# Patient Record
Sex: Male | Born: 1960 | Race: Black or African American | Hispanic: No | Smoking: Current every day smoker
Health system: Southern US, Community
[De-identification: ages and names within clinical notes are randomized; demographics above are authoritative.]

## PROBLEM LIST (undated history)

## (undated) DIAGNOSIS — E119 Type 2 diabetes mellitus without complications: Secondary | ICD-10-CM

## (undated) DIAGNOSIS — F32A Depression, unspecified: Secondary | ICD-10-CM

## (undated) DIAGNOSIS — I10 Essential (primary) hypertension: Secondary | ICD-10-CM

## (undated) DIAGNOSIS — B1920 Unspecified viral hepatitis C without hepatic coma: Secondary | ICD-10-CM

## (undated) DIAGNOSIS — F329 Major depressive disorder, single episode, unspecified: Secondary | ICD-10-CM

---

## 2014-12-07 ENCOUNTER — Emergency Department (HOSPITAL_COMMUNITY)
Admission: EM | Admit: 2014-12-07 | Discharge: 2014-12-07 | Disposition: A | Discharge status: Home / Self Care | Payer: Self-pay | Attending: Emergency Medicine | Admitting: Emergency Medicine

## 2014-12-07 ENCOUNTER — Encounter (HOSPITAL_COMMUNITY): Payer: Self-pay | Admitting: Emergency Medicine

## 2014-12-07 DIAGNOSIS — Z720 Tobacco use: Secondary | ICD-10-CM | POA: Insufficient documentation

## 2014-12-07 DIAGNOSIS — Z8659 Personal history of other mental and behavioral disorders: Secondary | ICD-10-CM | POA: Insufficient documentation

## 2014-12-07 DIAGNOSIS — Z9119 Patient's noncompliance with other medical treatment and regimen: Secondary | ICD-10-CM | POA: Insufficient documentation

## 2014-12-07 DIAGNOSIS — R739 Hyperglycemia, unspecified: Secondary | ICD-10-CM

## 2014-12-07 DIAGNOSIS — I10 Essential (primary) hypertension: Secondary | ICD-10-CM | POA: Insufficient documentation

## 2014-12-07 DIAGNOSIS — Z8619 Personal history of other infectious and parasitic diseases: Secondary | ICD-10-CM | POA: Insufficient documentation

## 2014-12-07 DIAGNOSIS — J029 Acute pharyngitis, unspecified: Secondary | ICD-10-CM | POA: Insufficient documentation

## 2014-12-07 DIAGNOSIS — Z9114 Patient's other noncompliance with medication regimen: Secondary | ICD-10-CM

## 2014-12-07 DIAGNOSIS — E1165 Type 2 diabetes mellitus with hyperglycemia: Secondary | ICD-10-CM | POA: Insufficient documentation

## 2014-12-07 HISTORY — DX: Essential (primary) hypertension: I10

## 2014-12-07 HISTORY — DX: Depression, unspecified: F32.A

## 2014-12-07 HISTORY — DX: Major depressive disorder, single episode, unspecified: F32.9

## 2014-12-07 HISTORY — DX: Unspecified viral hepatitis C without hepatic coma: B19.20

## 2014-12-07 HISTORY — DX: Type 2 diabetes mellitus without complications: E11.9

## 2014-12-07 LAB — URINALYSIS, ROUTINE W REFLEX MICROSCOPIC
Bilirubin Urine: NEGATIVE
Glucose, UA: >1000 mg/dL — AB
Hgb urine dipstick: NEGATIVE
KETONES UR: NEGATIVE mg/dL
LEUKOCYTES UA: NEGATIVE
NITRITE: NEGATIVE
PH: 6.5 (ref 5.0–8.0)
Protein, ur: NEGATIVE mg/dL
SPECIFIC GRAVITY, URINE: 1.028 (ref 1.005–1.030)
UROBILINOGEN UA: 1 mg/dL (ref 0.0–1.0)

## 2014-12-07 LAB — CBC
HEMATOCRIT: 33.9 % — AB (ref 39.0–52.0)
Hemoglobin: 12 g/dL — ABNORMAL LOW (ref 13.0–17.0)
MCH: 33.7 pg (ref 26.0–34.0)
MCHC: 35.4 g/dL (ref 30.0–36.0)
MCV: 95.2 fL (ref 78.0–100.0)
Platelets: 180 10*3/uL (ref 150–400)
RBC: 3.56 MIL/uL — AB (ref 4.22–5.81)
RDW: 11.9 % (ref 11.5–15.5)
WBC: 8.5 10*3/uL (ref 4.0–10.5)

## 2014-12-07 LAB — BASIC METABOLIC PANEL
ANION GAP: 10 (ref 5–15)
BUN: 14 mg/dL (ref 6–20)
CHLORIDE: 96 mmol/L — AB (ref 101–111)
CO2: 23 mmol/L (ref 22–32)
Calcium: 8.9 mg/dL (ref 8.9–10.3)
Creatinine, Ser: 1.08 mg/dL (ref 0.61–1.24)
GFR calc non Af Amer: >60 mL/min (ref >60)
Glucose, Bld: 523 mg/dL — ABNORMAL HIGH (ref 65–99)
POTASSIUM: 4.1 mmol/L (ref 3.5–5.1)
SODIUM: 129 mmol/L — AB (ref 135–145)

## 2014-12-07 LAB — CBG MONITORING, ED: Glucose-Capillary: 539 mg/dL — ABNORMAL HIGH (ref 65–99)

## 2014-12-07 LAB — URINE MICROSCOPIC-ADD ON

## 2014-12-07 LAB — RAPID STREP SCREEN (MED CTR MEBANE ONLY): STREPTOCOCCUS, GROUP A SCREEN (DIRECT): NEGATIVE

## 2014-12-07 MED ORDER — SODIUM CHLORIDE 0.9 % IV SOLN
1000.0000 mL | Freq: Once | INTRAVENOUS | Status: AC
  Administered 2014-12-07: 1000 mL via INTRAVENOUS

## 2014-12-07 MED ORDER — ACETAMINOPHEN 325 MG PO TABS
650.0000 mg | ORAL_TABLET | Freq: Once | ORAL | Status: AC
  Administered 2014-12-07: 650 mg via ORAL
  Filled 2014-12-07: qty 2

## 2014-12-07 MED ORDER — INSULIN ASPART 100 UNIT/ML ~~LOC~~ SOLN
10.0000 [IU] | Freq: Once | SUBCUTANEOUS | Status: AC
  Administered 2014-12-07: 10 [IU] via INTRAVENOUS
  Filled 2014-12-07: qty 1

## 2014-12-07 MED ORDER — IBUPROFEN 600 MG PO TABS
600.0000 mg | ORAL_TABLET | Freq: Three times a day (TID) | ORAL | Status: DC | PRN
Start: 2014-12-07 — End: 2015-11-25

## 2014-12-07 MED ORDER — MORPHINE SULFATE (PF) 4 MG/ML IV SOLN
4.0000 mg | Freq: Once | INTRAVENOUS | Status: AC
  Administered 2014-12-07: 4 mg via INTRAVENOUS
  Filled 2014-12-07: qty 1

## 2014-12-07 MED ORDER — INSULIN NPH ISOPHANE & REGULAR (70-30) 100 UNIT/ML ~~LOC~~ SUSP: 30.0000 [IU] | Freq: Two times a day (BID) | SUBCUTANEOUS | Status: AC

## 2014-12-07 MED ORDER — SODIUM CHLORIDE 0.9 % IV SOLN
1000.0000 mL | INTRAVENOUS | Status: DC
  Administered 2014-12-07: 1000 mL via INTRAVENOUS

## 2014-12-07 MED ORDER — CLINDAMYCIN HCL 300 MG PO CAPS
300.0000 mg | ORAL_CAPSULE | Freq: Once | ORAL | Status: AC
  Administered 2014-12-07: 300 mg via ORAL
  Filled 2014-12-07: qty 1

## 2014-12-07 MED ORDER — CLINDAMYCIN HCL 300 MG PO CAPS: 300.0000 mg | ORAL_CAPSULE | Freq: Three times a day (TID) | ORAL | Status: DC

## 2014-12-07 NOTE — Discharge Instructions (Signed)
Pharyngitis Pharyngitis is redness, pain, and swelling (inflammation) of your pharynx.  CAUSES  Pharyngitis is usually caused by infection. Most of the time, these infections are from viruses (viral) and are part of a cold. However, sometimes pharyngitis is caused by bacteria (bacterial). Pharyngitis can also be caused by allergies. Viral pharyngitis may be spread from person to person by coughing, sneezing, and personal items or utensils (cups, forks, spoons, toothbrushes). Bacterial pharyngitis may be spread from person to person by more intimate contact, such as kissing.  SIGNS AND SYMPTOMS  Symptoms of pharyngitis include:   Sore throat.   Tiredness (fatigue).   Low-grade fever.   Headache.  Joint pain and muscle aches.  Skin rashes.  Swollen lymph nodes.  Plaque-like film on throat or tonsils (often seen with bacterial pharyngitis). DIAGNOSIS  Your health care provider will ask you questions about your illness and your symptoms. Your medical history, along with a physical exam, is often all that is needed to diagnose pharyngitis. Sometimes, a rapid strep test is done. Other lab tests may also be done, depending on the suspected cause.  TREATMENT  Viral pharyngitis will usually get better in 3-4 days without the use of medicine. Bacterial pharyngitis is treated with medicines that kill germs (antibiotics).  HOME CARE INSTRUCTIONS   Drink enough water and fluids to keep your urine clear or pale yellow.   Only take over-the-counter or prescription medicines as directed by your health care provider:   If you are prescribed antibiotics, make sure you finish them even if you start to feel better.   Do not take aspirin.   Get lots of rest.   Gargle with 8 oz of salt water ( tsp of salt per 1 qt of water) as often as every 1-2 hours to soothe your throat.   Throat lozenges (if you are not at risk for choking) or sprays may be used to soothe your throat. SEEK MEDICAL  CARE IF:   You have large, tender lumps in your neck.  You have a rash.  You cough up green, yellow-brown, or bloody spit. SEEK IMMEDIATE MEDICAL CARE IF:   Your neck becomes stiff.  You drool or are unable to swallow liquids.  You vomit or are unable to keep medicines or liquids down.  You have severe pain that does not go away with the use of recommended medicines.  You have trouble breathing (not caused by a stuffy nose). MAKE SURE YOU:   Understand these instructions.  Will watch your condition.  Will get help right away if you are not doing well or get worse. Document Released: 04/06/2005 Document Revised: 01/25/2013 Document Reviewed: 12/12/2012 [facility] Patient Information 2015 [place], [place]. This information is not intended to replace advice given to you by your health care provider. Make sure you discuss any questions you have with your health care provider. Hyperglycemia Hyperglycemia occurs when the glucose (sugar) in your blood is too high. Hyperglycemia can happen for many reasons, but it most often happens to people who do not know they have diabetes or are not managing their diabetes properly.  CAUSES  Whether you have diabetes or not, there are other causes of hyperglycemia. Hyperglycemia can occur when you have diabetes, but it can also occur in other situations that you might not be as aware of, such as: Diabetes  If you have diabetes and are having problems controlling your blood glucose, hyperglycemia could occur because of some of the following reasons:  Not following your meal plan.  Not taking your diabetes medications or not taking it properly.  Exercising less or doing less activity than you normally do.  Being sick. Pre-diabetes  This cannot be ignored. Before people develop Type 2 diabetes, they almost always have "pre-diabetes." This is when your blood glucose levels are higher than normal, but not yet high enough to be diagnosed as diabetes.  Research has shown that some long-term damage to the body, especially the heart and circulatory system, may already be occurring during pre-diabetes. If you take action to manage your blood glucose when you have pre-diabetes, you may delay or prevent Type 2 diabetes from developing. Stress  If you have diabetes, you may be "diet" controlled or on oral medications or insulin to control your diabetes. However, you may find that your blood glucose is higher than usual in the hospital whether you have diabetes or not. This is often referred to as "stress hyperglycemia." Stress can elevate your blood glucose. This happens because of hormones put out by the body during times of stress. If stress has been the cause of your high blood glucose, it can be followed regularly by your caregiver. That way he/she can make sure your hyperglycemia does not continue to get worse or progress to diabetes. Steroids  Steroids are medications that act on the infection fighting system (immune system) to block inflammation or infection. One side effect can be a rise in blood glucose. Most people can produce enough extra insulin to allow for this rise, but for those who cannot, steroids make blood glucose levels go even higher. It is not unusual for steroid treatments to "uncover" diabetes that is developing. It is not always possible to determine if the hyperglycemia will go away after the steroids are stopped. A special blood test called an A1c is sometimes done to determine if your blood glucose was elevated before the steroids were started. SYMPTOMS  Thirsty.  Frequent urination.  Dry mouth.  Blurred vision.  Tired or fatigue.  Weakness.  Sleepy.  Tingling in feet or leg. DIAGNOSIS  Diagnosis is made by monitoring blood glucose in one or all of the following ways:  A1c test. This is a chemical found in your blood.  Fingerstick blood glucose monitoring.  Laboratory results. TREATMENT  First, knowing the  cause of the hyperglycemia is important before the hyperglycemia can be treated. Treatment may include, but is not be limited to:  Education.  Change or adjustment in medications.  Change or adjustment in meal plan.  Treatment for an illness, infection, etc.  More frequent blood glucose monitoring.  Change in exercise plan.  Decreasing or stopping steroids.  Lifestyle changes. HOME CARE INSTRUCTIONS   Test your blood glucose as directed.  Exercise regularly. Your caregiver will give you instructions about exercise. Pre-diabetes or diabetes which comes on with stress is helped by exercising.  Eat wholesome, balanced meals. Eat often and at regular, fixed times. Your caregiver or nutritionist will give you a meal plan to guide your sugar intake.  Being at an ideal weight is important. If needed, losing as little as 10 to 15 pounds may help improve blood glucose levels. SEEK MEDICAL CARE IF:   You have questions about medicine, activity, or diet.  You continue to have symptoms (problems such as increased thirst, urination, or weight gain). SEEK IMMEDIATE MEDICAL CARE IF:   You are vomiting or have diarrhea.  Your breath smells fruity.  You are breathing faster or slower.  You are very sleepy or incoherent.  You have numbness, tingling, or pain in your feet or hands.  You have chest pain.  Your symptoms get worse even though you have been following your caregiver's orders.  If you have any other questions or concerns. Document Released: 09/30/2000 Document Revised: 06/29/2011 Document Reviewed: 08/03/2011 [facility] Patient Information 2015 [place], [place]. This information is not intended to replace advice given to you by your health care provider. Make sure you discuss any questions you have with your health care provider.

## 2014-12-07 NOTE — ED Provider Notes (Signed)
CSN: [id number]     Arrival date & time 12/07/14  0505 History   First MD Initiated Contact with Patient 12/07/14 0518     Chief Complaint  Patient presents with  . Sore Throat  . Torticollis      HPI Patient reports sore throat over the past 48 hours.  He also reports some low back pain is not worse and is radiating up his back and into his neck.  He denies weakness of his arms or legs.  He denies pain with range of motion of his neck.  He reports ongoing sore throat.  He has not been on antibiotics.  He's tried Tylenol without improvement in symptoms.  He denies weakness of his arms or legs.  His pain is moderate in severity at this time.  He's tried Tylenol without improvement   Past Medical History  Diagnosis Date  . Diabetes mellitus without complication   . Hypertension   . Hepatitis C   . Depression    History reviewed. No pertinent past surgical history. No family history on file. Social History  Substance Use Topics  . Smoking status: Current Every Day Smoker -- 1.00 packs/day for 40 years  . Smokeless tobacco: None  . Alcohol Use: Yes     Comment: couple beers/day    Review of Systems  All other systems reviewed and are negative.     Allergies  Review of patient's allergies indicates no known allergies.  Home Medications   Prior to Admission medications   Medication Sig Start Date End Date Taking? Authorizing Provider  acetaminophen (TYLENOL) 500 MG tablet Take 1,000 mg by mouth every 6 (six) hours as needed for mild pain.   Yes Historical Provider, MD  RaNITidine HCl (ACID REDUCER PO) Take 1-2 tablets by mouth daily as needed (acid).   Yes Historical Provider, MD  clindamycin (CLEOCIN) 300 MG capsule Take 1 capsule (300 mg total) by mouth 3 (three) times daily. 12/07/14   Jola Schmidt, MD  ibuprofen (ADVIL,MOTRIN) 600 MG tablet Take 1 tablet (600 mg total) by mouth every 8 (eight) hours as needed. 12/07/14   Jola Schmidt, MD  insulin NPH-regular Human (NOVOLIN  70/30) (70-30) 100 UNIT/ML injection Inject 30 Units into the skin 2 (two) times daily with a meal. 12/07/14   Jola Schmidt, MD   BP 160/96 mmHg  Pulse 79  Temp(Src) 98.4 F (36.9 C) (Oral)  Resp 18  Ht 6' (1.829 m)  Wt 160 lb (72.576 kg)  BMI 21.70 kg/m2  SpO2 91% Physical Exam  Constitutional: He is oriented to person, place, and time. He appears well-developed and well-nourished.  HENT:  Head: Normocephalic and atraumatic.  Posterior pharyngeal erythema and exudate.  Uvula midline.  Tolerating secretions.  Oral airway patent.  Eyes: EOM are normal.  Neck: Normal range of motion.  Cardiovascular: Normal rate, regular rhythm, normal heart sounds and intact distal pulses.   Pulmonary/Chest: Effort normal and breath sounds normal. No respiratory distress.  Abdominal: Soft. He exhibits no distension. There is no tenderness.  Musculoskeletal: Normal range of motion.  Neurological: He is alert and oriented to person, place, and time.  Skin: Skin is warm and dry.  Psychiatric: He has a normal mood and affect. Judgment normal.  Nursing note and vitals reviewed.   ED Course  Procedures (including critical care time) Labs Review Labs Reviewed  BASIC METABOLIC PANEL - Abnormal; Notable for the following:    Sodium 129 (*)    Chloride 96 (*)  Glucose, Bld 523 (*)    All other components within normal limits  CBC - Abnormal; Notable for the following:    RBC 3.56 (*)    Hemoglobin 12.0 (*)    HCT 33.9 (*)    All other components within normal limits  CBG MONITORING, ED - Abnormal; Notable for the following:    Glucose-Capillary 539 (*)    All other components within normal limits  RAPID STREP SCREEN (NOT AT [facility])  CULTURE, GROUP A STREP  URINALYSIS, ROUTINE W REFLEX MICROSCOPIC (NOT AT [facility])  CBG MONITORING, ED    Imaging Review No results found. I have personally reviewed and evaluated these images and lab results as part of my medical decision-making.   EKG  Interpretation None      MDM   Final diagnoses:  Pharyngitis  Hyperglycemia  H/O medication noncompliance    Patient is feeling better after IV fluids and pain medication.  Likely pharyngitis.  Doubt meningitis.  No meningeal signs.  Full range of motion of neck.  Discharge home in good condition.  Patient will need a primary care physician.  Medication refills given for his insulin.  Home on clindamycin.    Jola Schmidt, MD 12/07/14 [phone]

## 2014-12-07 NOTE — ED Notes (Signed)
Pt in from home C/O stiff neck, neck pain, back pain. Also reports a sore throat. Did state he injured his back at work Wednesday and pain has gotten worse since then

## 2014-12-09 LAB — CULTURE, GROUP A STREP: STREP A CULTURE: NEGATIVE

## 2015-06-04 DIAGNOSIS — R739 Hyperglycemia, unspecified: Secondary | ICD-10-CM

## 2015-06-12 LAB — GLUCOSE, POCT (MANUAL RESULT ENTRY): POC GLUCOSE: 512 mg/dL — AB (ref 70–99)

## 2015-06-12 NOTE — Congregational Nurse Program (Signed)
Congregational Nurse Program Note  Date of Encounter: 06/04/2015  Past Medical History: Past Medical History  Diagnosis Date  . Diabetes mellitus without complication   . Hypertension   . Hepatitis C   . Depression     Encounter Details:     CNP Questionnaire - 06/06/15 1941    Patient Demographics   Is this a new or existing patient? New   Patient is considered a/an Not Applicable   Race African-American/Black   Patient Assistance   Location of Patient Assistance Not Applicable   Patient's financial/insurance status Low Income;[place]   Uninsured Patient Yes   Interventions Counseled to make appt. with provider   Patient referred to apply for the following financial assistance Not Applicable   Food insecurities addressed Provided food supplies   Transportation assistance No   Assistance securing medications No   Educational health offerings Chronic disease;Diabetes   Encounter Details   Primary purpose of visit Chronic Illness/Condition Visit;Education/Health Concerns   Was an Emergency Department visit averted? Yes   Does patient have a medical provider? Yes   Patient referred to Clinic   Was a mental health screening completed? (GAINS tool) No   Does patient have dental issues? No   Does patient have vision issues? No   Since previous encounter, have you referred patient for abnormal blood pressure that resulted in a new diagnosis or medication change? No   Since previous encounter, have you referred patient for abnormal blood glucose that resulted in a new diagnosis or medication change? No   For Abstraction Use Only   Does patient have insurance? No     Requested CBG, stated he felt like his "sugar was high".  CBG 512.  Self-administered insulin.  States he feels ok, just knew his "sugar was high"

## 2015-11-25 ENCOUNTER — Inpatient Hospital Stay (HOSPITAL_COMMUNITY): Payer: Medicaid Other

## 2015-11-25 ENCOUNTER — Encounter (HOSPITAL_COMMUNITY): Payer: Self-pay | Admitting: Emergency Medicine

## 2015-11-25 ENCOUNTER — Inpatient Hospital Stay (HOSPITAL_COMMUNITY)
Admission: EM | Admit: 2015-11-25 | Discharge: 2015-11-28 | DRG: 441 | Disposition: A | Discharge status: Hospice | Payer: Medicaid Other | Attending: Internal Medicine | Admitting: Internal Medicine

## 2015-11-25 ENCOUNTER — Emergency Department (HOSPITAL_COMMUNITY): Payer: Medicaid Other

## 2015-11-25 DIAGNOSIS — M549 Dorsalgia, unspecified: Secondary | ICD-10-CM | POA: Diagnosis present

## 2015-11-25 DIAGNOSIS — B370 Candidal stomatitis: Secondary | ICD-10-CM | POA: Diagnosis present

## 2015-11-25 DIAGNOSIS — E871 Hypo-osmolality and hyponatremia: Secondary | ICD-10-CM | POA: Diagnosis present

## 2015-11-25 DIAGNOSIS — R188 Other ascites: Secondary | ICD-10-CM | POA: Diagnosis present

## 2015-11-25 DIAGNOSIS — F329 Major depressive disorder, single episode, unspecified: Secondary | ICD-10-CM | POA: Diagnosis present

## 2015-11-25 DIAGNOSIS — F17200 Nicotine dependence, unspecified, uncomplicated: Secondary | ICD-10-CM | POA: Diagnosis present

## 2015-11-25 DIAGNOSIS — Z7189 Other specified counseling: Secondary | ICD-10-CM

## 2015-11-25 DIAGNOSIS — B1920 Unspecified viral hepatitis C without hepatic coma: Secondary | ICD-10-CM | POA: Diagnosis present

## 2015-11-25 DIAGNOSIS — I81 Portal vein thrombosis: Principal | ICD-10-CM | POA: Diagnosis present

## 2015-11-25 DIAGNOSIS — R1084 Generalized abdominal pain: Secondary | ICD-10-CM | POA: Diagnosis present

## 2015-11-25 DIAGNOSIS — C220 Liver cell carcinoma: Secondary | ICD-10-CM | POA: Diagnosis present

## 2015-11-25 DIAGNOSIS — Z794 Long term (current) use of insulin: Secondary | ICD-10-CM | POA: Diagnosis not present

## 2015-11-25 DIAGNOSIS — K920 Hematemesis: Secondary | ICD-10-CM | POA: Diagnosis not present

## 2015-11-25 DIAGNOSIS — Z7984 Long term (current) use of oral hypoglycemic drugs: Secondary | ICD-10-CM

## 2015-11-25 DIAGNOSIS — E119 Type 2 diabetes mellitus without complications: Secondary | ICD-10-CM | POA: Diagnosis present

## 2015-11-25 DIAGNOSIS — M7989 Other specified soft tissue disorders: Secondary | ICD-10-CM

## 2015-11-25 DIAGNOSIS — I10 Essential (primary) hypertension: Secondary | ICD-10-CM | POA: Diagnosis present

## 2015-11-25 DIAGNOSIS — E860 Dehydration: Secondary | ICD-10-CM | POA: Diagnosis present

## 2015-11-25 DIAGNOSIS — Z515 Encounter for palliative care: Secondary | ICD-10-CM | POA: Diagnosis present

## 2015-11-25 DIAGNOSIS — R64 Cachexia: Secondary | ICD-10-CM | POA: Diagnosis present

## 2015-11-25 DIAGNOSIS — R109 Unspecified abdominal pain: Secondary | ICD-10-CM

## 2015-11-25 DIAGNOSIS — Z720 Tobacco use: Secondary | ICD-10-CM

## 2015-11-25 DIAGNOSIS — Z66 Do not resuscitate: Secondary | ICD-10-CM | POA: Diagnosis not present

## 2015-11-25 DIAGNOSIS — R160 Hepatomegaly, not elsewhere classified: Secondary | ICD-10-CM

## 2015-11-25 DIAGNOSIS — E43 Unspecified severe protein-calorie malnutrition: Secondary | ICD-10-CM | POA: Diagnosis present

## 2015-11-25 LAB — HEPATIC FUNCTION PANEL
ALBUMIN: 2.7 g/dL — AB (ref 3.5–5.0)
ALT: 73 U/L — ABNORMAL HIGH (ref 17–63)
AST: 186 U/L — ABNORMAL HIGH (ref 15–41)
Alkaline Phosphatase: 333 U/L — ABNORMAL HIGH (ref 38–126)
BILIRUBIN INDIRECT: 0.9 mg/dL (ref 0.3–0.9)
BILIRUBIN TOTAL: 1.9 mg/dL — AB (ref 0.3–1.2)
Bilirubin, Direct: 1 mg/dL — ABNORMAL HIGH (ref 0.1–0.5)
TOTAL PROTEIN: 8.2 g/dL — AB (ref 6.5–8.1)

## 2015-11-25 LAB — BODY FLUID CELL COUNT WITH DIFFERENTIAL
Lymphs, Fluid: 16 %
Monocyte-Macrophage-Serous Fluid: 37 % — ABNORMAL LOW (ref 50–90)
Neutrophil Count, Fluid: 47 % — ABNORMAL HIGH (ref 0–25)
WBC FLUID: 780 uL (ref 0–1000)

## 2015-11-25 LAB — CBC
HEMATOCRIT: 37.7 % — AB (ref 39.0–52.0)
Hemoglobin: 13.1 g/dL (ref 13.0–17.0)
MCH: 31.7 pg (ref 26.0–34.0)
MCHC: 34.7 g/dL (ref 30.0–36.0)
MCV: 91.3 fL (ref 78.0–100.0)
Platelets: 321 10*3/uL (ref 150–400)
RBC: 4.13 MIL/uL — ABNORMAL LOW (ref 4.22–5.81)
RDW: 13 % (ref 11.5–15.5)
WBC: 9.5 10*3/uL (ref 4.0–10.5)

## 2015-11-25 LAB — COMPREHENSIVE METABOLIC PANEL
ALT: 73 U/L — AB (ref 17–63)
AST: 186 U/L — ABNORMAL HIGH (ref 15–41)
Albumin: 2.7 g/dL — ABNORMAL LOW (ref 3.5–5.0)
Alkaline Phosphatase: 333 U/L — ABNORMAL HIGH (ref 38–126)
Anion gap: 9 (ref 5–15)
BILIRUBIN TOTAL: 1.9 mg/dL — AB (ref 0.3–1.2)
BUN: 12 mg/dL (ref 6–20)
CALCIUM: 8.4 mg/dL — AB (ref 8.9–10.3)
CHLORIDE: 86 mmol/L — AB (ref 101–111)
CO2: 25 mmol/L (ref 22–32)
Creatinine, Ser: 0.84 mg/dL (ref 0.61–1.24)
GLUCOSE: 196 mg/dL — AB (ref 65–99)
Potassium: 4 mmol/L (ref 3.5–5.1)
Sodium: 120 mmol/L — ABNORMAL LOW (ref 135–145)
TOTAL PROTEIN: 8.2 g/dL — AB (ref 6.5–8.1)

## 2015-11-25 LAB — SODIUM, URINE, RANDOM

## 2015-11-25 LAB — GLUCOSE, CAPILLARY
GLUCOSE-CAPILLARY: 138 mg/dL — AB (ref 65–99)
Glucose-Capillary: 81 mg/dL (ref 65–99)

## 2015-11-25 LAB — OSMOLALITY, URINE: Osmolality, Ur: 340 mOsm/kg (ref 300–900)

## 2015-11-25 LAB — OSMOLALITY: OSMOLALITY: 276 mosm/kg (ref 275–295)

## 2015-11-25 LAB — URINALYSIS, ROUTINE W REFLEX MICROSCOPIC
BILIRUBIN URINE: NEGATIVE
Glucose, UA: NEGATIVE mg/dL
HGB URINE DIPSTICK: NEGATIVE
KETONES UR: NEGATIVE mg/dL
Leukocytes, UA: NEGATIVE
Nitrite: NEGATIVE
PH: 6 (ref 5.0–8.0)
Protein, ur: NEGATIVE mg/dL
SPECIFIC GRAVITY, URINE: 1.035 — AB (ref 1.005–1.030)

## 2015-11-25 LAB — LIPASE, BLOOD: LIPASE: 35 U/L (ref 11–51)

## 2015-11-25 LAB — PROTEIN, BODY FLUID: Total protein, fluid: <3 g/dL

## 2015-11-25 LAB — HEPARIN LEVEL (UNFRACTIONATED): HEPARIN UNFRACTIONATED: 0.1 [IU]/mL — AB (ref 0.30–0.70)

## 2015-11-25 LAB — URIC ACID: URIC ACID, SERUM: 4.7 mg/dL (ref 4.4–7.6)

## 2015-11-25 LAB — PROTEIN, TOTAL: Total Protein: 8.7 g/dL — ABNORMAL HIGH (ref 6.5–8.1)

## 2015-11-25 LAB — PROTIME-INR
INR: 1.3
Prothrombin Time: 16.3 seconds — ABNORMAL HIGH (ref 11.4–15.2)

## 2015-11-25 LAB — ALBUMIN, FLUID (OTHER): Albumin, Fluid: <1 g/dL

## 2015-11-25 LAB — CREATININE, URINE, RANDOM: Creatinine, Urine: 55.34 mg/dL

## 2015-11-25 LAB — APTT: APTT: 34 s (ref 24–36)

## 2015-11-25 MED ORDER — INSULIN ASPART 100 UNIT/ML ~~LOC~~ SOLN
0.0000 [IU] | Freq: Three times a day (TID) | SUBCUTANEOUS | Status: DC
Start: 2015-11-26 — End: 2015-11-28
  Administered 2015-11-26: 3 [IU] via SUBCUTANEOUS
  Administered 2015-11-27 (×2): 2 [IU] via SUBCUTANEOUS

## 2015-11-25 MED ORDER — FLUCONAZOLE IN SODIUM CHLORIDE 200-0.9 MG/100ML-% IV SOLN
200.0000 mg | Freq: Once | INTRAVENOUS | Status: AC
  Administered 2015-11-25: 200 mg via INTRAVENOUS
  Filled 2015-11-25: qty 100

## 2015-11-25 MED ORDER — ALBUMIN HUMAN 25 % IV SOLN
50.0000 g | Freq: Once | INTRAVENOUS | Status: AC | PRN
  Administered 2015-11-25: 50 g via INTRAVENOUS
  Filled 2015-11-25: qty 200

## 2015-11-25 MED ORDER — HEPARIN BOLUS VIA INFUSION
1500.0000 [IU] | Freq: Once | INTRAVENOUS | Status: AC
  Administered 2015-11-25: 1500 [IU] via INTRAVENOUS
  Filled 2015-11-25: qty 1500

## 2015-11-25 MED ORDER — HEPARIN BOLUS VIA INFUSION
2000.0000 [IU] | Freq: Once | INTRAVENOUS | Status: AC
  Administered 2015-11-25: 2000 [IU] via INTRAVENOUS
  Filled 2015-11-25: qty 2000

## 2015-11-25 MED ORDER — NYSTATIN 100000 UNIT/ML MT SUSP
5.0000 mL | Freq: Four times a day (QID) | OROMUCOSAL | Status: DC
  Administered 2015-11-25 – 2015-11-27 (×8): 500000 [IU] via OROMUCOSAL
  Filled 2015-11-25 (×8): qty 5

## 2015-11-25 MED ORDER — SODIUM CHLORIDE 0.9 % IV SOLN
INTRAVENOUS | Status: DC
  Administered 2015-11-25: 18:00:00 via INTRAVENOUS

## 2015-11-25 MED ORDER — HYDROCODONE-ACETAMINOPHEN 5-325 MG PO TABS
1.0000 | ORAL_TABLET | ORAL | Status: DC | PRN
  Administered 2015-11-25 – 2015-11-26 (×2): 2 via ORAL
  Filled 2015-11-25 (×2): qty 2

## 2015-11-25 MED ORDER — HEPARIN (PORCINE) IN NACL 100-0.45 UNIT/ML-% IJ SOLN
1500.0000 [IU]/h | INTRAMUSCULAR | Status: DC
  Administered 2015-11-25 – 2015-11-26 (×3): 1500 [IU]/h via INTRAVENOUS
  Filled 2015-11-25 (×5): qty 250

## 2015-11-25 MED ORDER — IOPAMIDOL (ISOVUE-300) INJECTION 61%
100.0000 mL | Freq: Once | INTRAVENOUS | Status: AC | PRN
  Administered 2015-11-25: 100 mL via INTRAVENOUS

## 2015-11-25 MED ORDER — ENSURE ENLIVE PO LIQD: 237.0000 mL | Freq: Two times a day (BID) | ORAL | Status: DC

## 2015-11-25 MED ORDER — HEPARIN BOLUS VIA INFUSION
2500.0000 [IU] | Freq: Once | INTRAVENOUS | Status: DC
  Filled 2015-11-25: qty 2500

## 2015-11-25 MED ORDER — ONDANSETRON HCL 4 MG PO TABS: 4.0000 mg | ORAL_TABLET | Freq: Four times a day (QID) | ORAL | Status: DC | PRN

## 2015-11-25 MED ORDER — ONDANSETRON HCL 4 MG/2ML IJ SOLN
4.0000 mg | Freq: Four times a day (QID) | INTRAMUSCULAR | Status: DC | PRN
  Filled 2015-11-25 (×2): qty 2

## 2015-11-25 MED ORDER — SODIUM CHLORIDE 0.9% FLUSH: 3.0000 mL | Freq: Two times a day (BID) | INTRAVENOUS | Status: DC

## 2015-11-25 MED ORDER — MORPHINE SULFATE (PF) 4 MG/ML IV SOLN
4.0000 mg | Freq: Once | INTRAVENOUS | Status: AC
  Administered 2015-11-25: 4 mg via INTRAVENOUS
  Filled 2015-11-25: qty 1

## 2015-11-25 MED ORDER — FLUCONAZOLE IN SODIUM CHLORIDE 100-0.9 MG/50ML-% IV SOLN
100.0000 mg | INTRAVENOUS | Status: DC
  Administered 2015-11-26 – 2015-11-27 (×2): 100 mg via INTRAVENOUS
  Filled 2015-11-25 (×3): qty 50

## 2015-11-25 MED ORDER — INSULIN ASPART 100 UNIT/ML ~~LOC~~ SOLN: 0.0000 [IU] | Freq: Every day | SUBCUTANEOUS | Status: DC

## 2015-11-25 MED ORDER — HEPARIN (PORCINE) IN NACL 100-0.45 UNIT/ML-% IJ SOLN
1300.0000 [IU]/h | INTRAMUSCULAR | Status: DC
  Administered 2015-11-25: 1300 [IU]/h via INTRAVENOUS
  Filled 2015-11-25: qty 250

## 2015-11-25 MED ORDER — PRO-STAT SUGAR FREE PO LIQD
30.0000 mL | Freq: Three times a day (TID) | ORAL | Status: DC
  Administered 2015-11-26 (×3): 30 mL via ORAL
  Filled 2015-11-25 (×4): qty 30

## 2015-11-25 NOTE — ED Notes (Signed)
Patient not in room, will attempt to draw later.

## 2015-11-25 NOTE — ED Triage Notes (Signed)
Pt reports abd swelling for the past week. No n/v/d. LBM 2 days ago. Also reports L flank pain accompanied by dysuria. Also having bilateral foot swelling.

## 2015-11-25 NOTE — ED Notes (Addendum)
Dr. Susa Griffins at the bedside.  Per Dr Susa Griffins patient needs foley catheter placed emergently for strict I & O due to "severe hyponatremia".  Hospitalist states note will be made for emergent foley placement in ED.  PA made aware.  Foley placed by this RN with Verdie Drown, NT at the bedside.

## 2015-11-25 NOTE — H&P (Signed)
TRH H&P   Patient Demographics:    Peter Anthony, is a 55 y.o. male  MRN: [id number]   DOB - Apr 27, 1960  Admit Date - 11/25/2015  Outpatient Primary MD for the patient is No primary care provider on file.  Outpatient Specialists: None   Patient coming from: None, goes to shelter clinic  Chief Complaint  Patient presents with  . Abdominal Pain  . Flank Pain  . Foot Swelling      HPI:    Peter Anthony  is a 55 y.o. male, History of type 2 diabetes mellitus on insulin, hepatitis C, who was doing fairly well until about 2 weeks ago he started experiencing sudden onset of abdominal pain radiating to his back with lower extremity swelling in both legs, he continued to have this symptom and came to the ER.  In the ER he was found to be severely cachectic, he had abdominal distention with lower extremity edema, CT scan suggestive of new diagnosis of hepatocellular carcinoma with portal vein thrombosis, he was also found to be hyponatremic. I was called to admit the patient.  She denies any previous history of hepatocellular carcinoma but does say that he had hepatitis C diagnosed several years ago he does not know how he contracted it, he does not have a PCP or her regular GI physician and he goes to the shelter clinic. He does condone to 20 pounds of unintentional weight loss in the last 1 month.    Review of systems:    In addition to the HPI above,   No Fever-chills, No Headache, No changes with Vision or hearing, Positive problems swallowing food or Liquids, No Chest pain, Cough or Shortness of Breath, Positive Abdominal pain, No Nausea or Vommitting, Bowel movements are regular, No Blood in stool or Urine, No  dysuria, No new skin rashes or bruises, No new joints pains-aches,  No new weakness, tingling, numbness in any extremity, No recent weight gain but 20 pound unintentional weight loss over the last 1 month, No polyuria, polydypsia or polyphagia, No significant Mental Stressors.  A full 10 point Review of Systems was done, except as stated above, all other Review of Systems were negative.   With Past History of the following :    Past Medical History:  Diagnosis Date  . Depression   . Diabetes  mellitus without complication ([place])   . Hepatitis C   . Hypertension       History reviewed. No pertinent surgical history.    Social History:     Social History  Substance Use Topics  . Smoking status: Current Every Day Smoker    Packs/day: 1.00    Years: 40.00  . Smokeless tobacco: Not on file  . Alcohol use Yes     Comment: couple beers/day         Family History :   No history of hepatocellular carcinoma   Home Medications:   Prior to Admission medications   Medication Sig Start Date End Date Taking? Authorizing Provider  Doxylamine Succinate, Sleep, (SLEEP AID PO) Take 2-3 tablets by mouth every 4 (four) hours as needed (for sleep).   Yes Historical Provider, MD  ibuprofen (ADVIL,MOTRIN) 200 MG tablet Take 400 mg by mouth every 4 (four) hours as needed for fever, headache, mild pain, moderate pain or cramping.   Yes Historical Provider, MD  insulin NPH-regular Human (NOVOLIN 70/30) (70-30) 100 UNIT/ML injection Inject 30 Units into the skin 2 (two) times daily with a meal. 12/07/14  Yes Jola Schmidt, MD  metFORMIN (GLUCOPHAGE) 500 MG tablet Take 500 mg by mouth 2 (two) times daily with a meal.   Yes Historical Provider, MD     Allergies:    No Known Allergies   Physical Exam:   Vitals  Blood pressure 127/93, pulse 80, temperature 97.6 F (36.4 C), temperature source Oral, resp. rate 20, SpO2 100 %.   1. General   cachectic middle-aged African-American male  lying in hospital bed in mild to moderate discomfort due to abdominal pain,  2. Normal affect and insight, Not Suicidal or Homicidal, Awake Alert, Oriented X 3.  3. No F.N deficits, ALL C.Nerves Intact, Strength 5/5 all 4 extremities, Sensation intact all 4 extremities, Plantars down going.  4. Ears and Eyes appear Normal, Conjunctivae clear, PERRLA. Moist Oral Mucosa. Evidence of oropharyngeal candidiasis.  5. Supple Neck, No JVD, No cervical lymphadenopathy appriciated, No Carotid Bruits.  6. Symmetrical Chest wall movement, Good air movement bilaterally, CTAB.  7. RRR, No Gallops, Rubs or Murmurs, No Parasternal Heave.  8. Positive Bowel Sounds, Abdomen is distended, No tenderness, No organomegaly appriciated,No rebound -guarding or rigidity.  9.  No Cyanosis, Normal Skin Turgor, No Skin Rash or Bruise. 1-2+ leg edema  10. Good muscle tone,  joints appear normal , no effusions, Normal ROM.  11. No Palpable Lymph Nodes in Neck or Axillae      Data Review:    CBC  Recent Labs Lab 11/25/15 1038  WBC 9.5  HGB 13.1  HCT 37.7*  PLT 321  MCV 91.3  MCH 31.7  MCHC 34.7  RDW 13.0   ------------------------------------------------------------------------------------------------------------------  Chemistries   Recent Labs Lab 11/25/15 1038  NA 120*  K 4.0  CL 86*  CO2 25  GLUCOSE 196*  BUN 12  CREATININE 0.84  CALCIUM 8.4*  AST 186*  186*  ALT 73*  73*  ALKPHOS 333*  333*  BILITOT 1.9*  1.9*   ------------------------------------------------------------------------------------------------------------------ CrCl cannot be calculated (Unknown ideal weight.). ------------------------------------------------------------------------------------------------------------------ No results for input(s): TSH, T4TOTAL, T3FREE, THYROIDAB in the last 72 hours.  Invalid input(s): FREET3  Coagulation profile No results for input(s): INR, PROTIME in the last 168  hours. ------------------------------------------------------------------------------------------------------------------- No results for input(s): DDIMER in the last 72 hours. -------------------------------------------------------------------------------------------------------------------  Cardiac Enzymes No results for input(s): CKMB, TROPONINI, MYOGLOBIN in the last 168  hours.  Invalid input(s): CK ------------------------------------------------------------------------------------------------------------------ No results found for: BNP   ---------------------------------------------------------------------------------------------------------------  Urinalysis    Component Value Date/Time   COLORURINE YELLOW 12/07/2014 [place] 12/07/2014 0635   LABSPEC 1.028 12/07/2014 0635   PHURINE 6.5 12/07/2014 0635   GLUCOSEU >1000 (A) 12/07/2014 0635   HGBUR NEGATIVE 12/07/2014 0635   BILIRUBINUR NEGATIVE 12/07/2014 0635   KETONESUR NEGATIVE 12/07/2014 0635   PROTEINUR NEGATIVE 12/07/2014 0635   UROBILINOGEN 1.0 12/07/2014 0635   NITRITE NEGATIVE 12/07/2014 0635   LEUKOCYTESUR NEGATIVE 12/07/2014 0635    ----------------------------------------------------------------------------------------------------------------   Imaging Results:    Dg Chest 2 View  Result Date: 11/25/2015 CLINICAL DATA:  Bilateral lower extremity swelling for 2 weeks. EXAM: CHEST  2 VIEW COMPARISON:  None. FINDINGS: Asymmetric elevation right hemidiaphragm noted. The lungs are clear wiithout focal pneumonia, edema, pneumothorax or pleural effusion. The cardiopericardial silhouette is within normal limits for size. The visualized bony structures of the thorax are intact. Telemetry leads overlie the chest. IMPRESSION: Asymmetric elevation right hemidiaphragm. No acute cardiopulmonary findings. Electronically Signed   By: Misty Stanley M.D.   On: 11/25/2015 12:33   Ct Abdomen Pelvis W  Contrast  Result Date: 11/25/2015 CLINICAL DATA:  Abdominal swelling for the last week. Left flank pain accompanied by dysuria. EXAM: CT ABDOMEN AND PELVIS WITH CONTRAST TECHNIQUE: Multidetector CT imaging of the abdomen and pelvis was performed using the standard protocol following bolus administration of intravenous contrast. CONTRAST:  126mL ISOVUE-300 IOPAMIDOL (ISOVUE-300) INJECTION 61% COMPARISON:  None. FINDINGS: Lower chest:  Unremarkable. Hepatobiliary: Multiple mass lesions are evident within the liver, involving the right liver and medial segment left liver. One of the dominant lesions measures up to 7.5 cm in diameter common fall in the inferior right liver. This is associated with occlusive thrombus in the right portal venous system. Liver contour is slightly nodular in this patient with a reported history of hepatitis-C infection. There is some trace intrahepatic biliary duct dilatation around the dominant central liver mass. Small calcified gallstones noted. No extrahepatic biliary duct dilatation. Pancreas: No focal mass lesion. No dilatation of the main duct. No intraparenchymal cyst. No peripancreatic edema. Spleen: No splenomegaly. No focal mass lesion. Adrenals/Urinary Tract: No adrenal nodule or mass. Kidneys are unremarkable. No evidence for hydroureter. The urinary bladder appears normal for the degree of distention. Stomach/Bowel: Small hiatal hernia. Stomach is nondistended. No gastric wall thickening. No evidence of outlet obstruction. Duodenum is normally positioned as is the ligament of Treitz. There are some mildly distended distal small bowel loops. Terminal ileum is normal. Appendix is normal. No gross colonic mass. No colonic wall thickening. No substantial diverticular change. Vascular/Lymphatic: There is abdominal aortic atherosclerosis without aneurysm. As mentioned above, there is occlusive thrombus in the right portal venous system. Left portal vein and superior mesenteric vein  are prominent, but patent. The splenic vein is patent. Recanalization of the paraumbilical vein is associated with paraesophageal varices, features compatible with portal venous hypertension. There is no gastrohepatic or hepatoduodenal ligament lymphadenopathy. No intraperitoneal or retroperitoneal lymphadenopathy. No pelvic sidewall lymphadenopathy. Reproductive: The prostate gland and seminal vesicles have normal imaging features. Other: Large volume ascites evident. Musculoskeletal: Bone windows reveal no worrisome lytic or sclerotic osseous lesions. IMPRESSION: 1. Multiple large hepatic mass is involving the medial segment left liver and diffusely in the right liver are associated with occlusive thrombus of the right portal venous system with extension of thrombus into the confluence with the left portal vein. Main portal vein remains  patent. Given the history of hepatitis C and the apparent nodular hepatic contour suggesting background cirrhosis, imaging features are consistent with multifocal hepatocellular carcinoma. 2. Large volume ascites. 3. Cholelithiasis. Electronically Signed   By: Misty Stanley M.D.   On: 11/25/2015 12:29    Baseline EKG ordered   Assessment & Plan:     1. Abdominal pain due to portal vein thrombosis with new diagnosis of most likely hepatocellular carcinoma. This likely has caused ascites with lower extremity edema, will be admitted to telemetry, IV heparin drip, check AFP, GI and oncology have been called. We will also order her some guided paracentesis both for therapeutic and diagnostic purposes. His prognosis appears very guarded as he is extremely cachectic, have involved palliative care as well. Patient for now wants to be full code.  2. DM type II. Check A1c and sliding scale.  3. History of hep C. Follow with GI outpatient.  4. Severe cachexia and severe protein calorie malnutrition. Will place on protein supplement, likely due to #1 above, will also check  HIV.  5. Oropharyngeal candidiasis. Diflucan per pharmacy, nystatin swish and swallow. For now dysphagia 1 diet with speech to evaluate. He is having problems swallowing food or liquids.  6. Appears dehydrated. He appears dehydrated. Gentle normal saline, check urine osmolar T, serum osmolar T along with urine sodium. Repeat BMP. Note he does appear to have acute ascites but this should not account for his hyponatremia.   DVT Prophylaxis Heparin   AM Labs Ordered, also please review Full Orders  Family Communication: Admission, patients condition and plan of care including tests being ordered have been discussed with the patient   who indicates understanding and agree with the plan and Code Status.  Code Status Full  Likely DC to  TBD  Condition GUARDED     Consults called: GI, oncology    Admission status: Inpt  Time spent in minutes : 35   Daneka Lantigua K M.D on 11/25/2015 at 1:36 PM  Between 7am to 7pm - Pager - [phone]. After 7pm go to [email or web address] - password [facility]  [facility] - Office  [phone]

## 2015-11-25 NOTE — Progress Notes (Signed)
PHARMACIST - PHYSICIAN COMMUNICATION CONCERNING:  IV heparin  60 yoM on IV heparin for portal vein thrombosis.  Please see note written by Reuel Boom, PharmD on 11/25/15 for more details.    Heparin currently running at 1300 units/hr.  First heparin level = 0 .10, subtherapeutic (goal 0.3-0.7).  CrCl > 30 ml/min. No bleeding issues noted.    Heparin dosing wt = 60kg.   RECOMMENDATION: Increase to IV heparin 1500 units/hr (=15 ml/hr). Bolus IV heparin 1500 units x1.   F/u heparin level in 6 hours.    Ralene Bathe, PharmD, BCPS 11/25/2015, 8:06 PM  Pager: [phone]

## 2015-11-25 NOTE — Progress Notes (Signed)
CM spoke with pt who confirms uninsured [employer] resident with no pcp.  CM discussed and provided written information to assist pt with determining choice for uninsured accepting pcps, discussed the importance of pcp vs EDP services for f/u care, www.needymeds.org, www.goodrx.com, discounted pharmacies and other [employer] such as [employer] [facility] [employer], affordable care act, financial assistance, uninsured dental services, [place] med assist, DSS and  health department  Reviewed resources for [employer] uninsured accepting pcps like [facility], family medicine at [employer], [facility] [place], [facility], local urgent care centers, [facility], [facility] family practice, general medical clinics, family services of the [place], [facility] urgent care plus others, medication resources, [facility] out patient pharmacies and housing Pt voiced understanding and appreciation of resources provided   Provided [facility] contact information Pt agreed to a referral Cm completed referral Pt to be contact by [facility] clinical liaison

## 2015-11-25 NOTE — Progress Notes (Addendum)
ANTICOAGULATION CONSULT NOTE - Initial Consult  Pharmacy Consult for Heparin IV Indication: portal vein thrombosis  No Known Allergies  Patient Measurements:   Heparin Dosing Weight: 60 kg  Vital Signs: Temp: 97.6 F (36.4 C) (08/07 1019) Temp Source: Oral (08/07 1019) BP: 127/93 (08/07 1019) Pulse Rate: 80 (08/07 1019)  Labs:  Recent Labs  11/25/15 1038  HGB 13.1  HCT 37.7*  PLT 321  CREATININE 0.84    CrCl cannot be calculated (Unknown ideal weight.).   Medical History: Past Medical History:  Diagnosis Date  . Depression   . Diabetes mellitus without complication ([place])   . Hepatitis C   . Hypertension     Medications:   (Not in a hospital admission) Scheduled:  . heparin  2,500 Units Intravenous Once    Assessment: 46 yoM with PMH DM, HepC, presents with abdominal pain and bilateral edema.  Found to have likely hepatocellular carcinoma with multiple liver masses and associated portal vein thrombosis. Pharmacy to dose heparin IV.   Baseline INR, aPTT: pending  Prior anticoagulation: none  Significant events:  Today, 11/25/2015:  CBC: wnl  No current bleeding issues, although coag labs unknown  CrCl: SCr wnl  Goal of Therapy: Heparin level 0.3-0.7 units/ml Monitor platelets by anticoagulation protocol: Yes  Plan:  Heparin 2000 units IV bolus x 1  Heparin 1300 units/hr IV infusion  Check heparin level 6 hrs after start  Daily CBC, daily heparin level once stable  Monitor for signs of bleeding or thrombosis   Reuel Boom, PharmD Pager: [phone] 11/25/2015, 1:47 PM

## 2015-11-25 NOTE — Progress Notes (Signed)
Entered in d/c intructions partnership for [facility] orange card program     [phone] A referral has been made for you to this company to see if you are a candidate for orange card services  You will be contacted via phone or postcard in mail about how to come in for completing an application    Next Steps: Follow up on 11/28/2015  Instructions: You may contact them as needed to assist with getting more information

## 2015-11-25 NOTE — ED Notes (Signed)
Patient remains in ultrasound.

## 2015-11-25 NOTE — Progress Notes (Signed)
[facility] CONSULT NOTE  No care team member to display  CHIEF COMPLAINTS/PURPOSE OF CONSULTATION:  Newly diagnosed Liver masses  HISTORY OF PRESENTING ILLNESS:  Peter Anthony 55 y.o. male is here because of recent diagnosis of discitis with multiple liver metastases. He has known history of hepatitis C and has not been on any therapy for it. He's also been jaundiced for a while. He came to the emergency room complaining of abdominal distention and left flank pain and discomfort. He is also complaining of abnormal taste in the mouth related to thrush.  I reviewed her records extensively and collaborated the history with the patient.  MEDICAL HISTORY:  Past Medical History:  Diagnosis Date  . Depression   . Diabetes mellitus without complication ([place])   . Hepatitis C   . Hypertension     SURGICAL HISTORY: History reviewed. No pertinent surgical history.  SOCIAL HISTORY: Social History   Social History  . Marital status: Legally Separated    Spouse name: N/A  . Number of children: N/A  . Years of education: N/A   Occupational History  . Not on file.   Social History Main Topics  . Smoking status: Current Every Day Smoker    Packs/day: 1.00    Years: 40.00  . Smokeless tobacco: Never Used  . Alcohol use Yes     Comment: couple beers/day  . Drug use: No  . Sexual activity: Not Currently   Other Topics Concern  . Not on file   Social History Narrative  . No narrative on file    FAMILY HISTORY: History reviewed. No pertinent family history.  ALLERGIES:  has No Known Allergies.  MEDICATIONS:  Current Facility-Administered Medications  Medication Dose Route Frequency Provider Last Rate Last Dose  . heparin ADULT infusion 100 units/mL (25000 units/258mL sodium chloride 0.45%)  1,300 Units/hr Intravenous Continuous Polly Cobia, RPH 13 mL/hr at 11/25/15 1408 1,300 Units/hr at 11/25/15 1408    REVIEW OF SYSTEMS:   Constitutional: Denies fevers,  chills or abnormal night sweats Eyes: jaundiced Ears, nose, mouth, throat, and face: Thrush Respiratory: Denies cough, dyspnea or wheezes Cardiovascular: Denies palpitation, chest discomfort or lower extremity swelling Gastrointestinal:   abdominal distention with ascites and prominent abdominal veins related to portal hypertension Neurological:Denies numbness, tingling or new weaknesses Behavioral/Psych: Mood is stable, no new changes   All other systems were reviewed with the patient and are negative.  PHYSICAL EXAMINATION: ECOG PERFORMANCE STATUS: 3 - Symptomatic, >50% confined to bed  Vitals:   11/25/15 1551 11/25/15 1617  BP: 137/97 127/89  Pulse:  78  Resp:  18  Temp:     Filed Weights   11/25/15 1347  Weight: 132 lb 12.8 oz (60.2 kg)    GENERAL:alert, no distress and comfortable SKIN: Jaundiced EYES: Jaundiced OROPHARYNX:thrush NECK: supple, thyroid normal size, non-tender, without nodularity LYMPH:  no palpable lymphadenopathy in the cervical, axillary or inguinal LUNGS: clear to auscultation and percussion with normal breathing effort HEART: regular rate & rhythm and no murmurs and no lower extremity edema ABDOMEN:marked ascites with abd veins from portal HTN Musculoskeletal:no cyanosis of digits and no clubbing  PSYCH: alert & oriented x 3 with fluent speech NEURO: no focal motor/sensory deficits  LABORATORY DATA:  I have reviewed the data as listed Lab Results  Component Value Date   WBC 9.5 11/25/2015   HGB 13.1 11/25/2015   HCT 37.7 (L) 11/25/2015   MCV 91.3 11/25/2015   PLT 321 11/25/2015  Lab Results  Component Value Date   NA 120 (L) 11/25/2015   K 4.0 11/25/2015   CL 86 (L) 11/25/2015   CO2 25 11/25/2015    RADIOGRAPHIC STUDIES: I have personally reviewed the radiological reports and agreed with the findings in the report.  ASSESSMENT AND PLAN:  Probable Hepatocellular  Cancer: I discussed the patient that his liver is extensively  involved with multiple liver lesions suspicious for primary hepatocellular carcinoma given his history of hepatitis C. There is no other intra-abdominal tumor or masses to suggest any abdominal primaries other than the liver.  If he has been sent and results are pending.  I discussed the patient that there is no left hepatectomy.  Patient could be treated with some last night with very limited clinical benefit and potential high risk of side effects.   Patient decided that he does not want to go through any specific treatment and that he would want to enjoy his life that time he has left.  I agree with Dr. Candiss Norse that he would benefit from hospice care.  I discussed with him that he has less than 6 months average survival.  All questions were answered. The patient knows to call the clinic with any problems, questions or concerns.    Rulon Eisenmenger, MD @T @

## 2015-11-25 NOTE — ED Notes (Signed)
Pt unsuccessful IV attempt to right forearm and right AC.

## 2015-11-25 NOTE — Progress Notes (Signed)
Pharmacy Antibiotic Note  Peter Anthony is a 55 y.o. male known to pharmacy from current heparin dosing.  Pharmacy has been consulted for Diflucan dosing for oropharyngeal candidiasis  Plan:  Diflucan 200 mg IV now, then 100 mg IV q24 hr  With stable renal function, future adjustments unlikely; will sign off at this time  Height: 6' (182.9 cm) Weight: 132 lb (59.9 kg) IBW/kg (Calculated) : 77.6  Temp (24hrs), Avg:97.7 F (36.5 C), Min:97.6 F (36.4 C), Max:97.8 F (36.6 C)   Recent Labs Lab 11/25/15 1038  WBC 9.5  CREATININE 0.84    Estimated Creatinine Clearance: 85.2 mL/min (by C-G formula based on SCr of 0.84 mg/dL).    No Known Allergies   Thank you for allowing pharmacy to be a part of this patient's care.  Reuel Boom, PharmD, BCPS Pager: [phone] 11/25/2015, 5:42 PM

## 2015-11-25 NOTE — ED Notes (Signed)
Pt was notified about Urinalysis

## 2015-11-25 NOTE — ED Provider Notes (Signed)
[facility] DEPT Provider Note   CSN: [id number] Arrival date & time: 11/25/15  1002  First Provider Contact:  First MD Initiated Contact with Patient 11/25/15 1049        History   Chief Complaint Chief Complaint  Patient presents with  . Abdominal Pain  . Flank Pain  . Foot Swelling    HPI MUJAHID BARTOLD is a 55 y.o. male.  The history is provided by the patient and medical records. No language interpreter was used.  Abdominal Pain   Associated symptoms include nausea. Pertinent negatives include diarrhea, vomiting, constipation and dysuria.  Flank Pain  Associated symptoms include abdominal pain. Pertinent negatives include no chest pain and no shortness of breath.   ROYER MAISANO is a 55 y.o. male  with a PMH of HepC, HTN, DM who presents to the Emergency Department complaining of severe generalized abdominal pain and left sided flank pain that has been progressively worsening x 1 week. Associated abdominal distension, bilateral leg swelling, and nausea. Denies shortness of breath, chest pain, fever, emesis, diarrhea, constipation. Patient states he has been taking 6-8 ibuprofen at night for pain relief over the last week. Denies heavy drinking, but does state he drinks a couple beers a week. Does not have PCP. No known cardiac history. He was diagnosed with Hep C, but states he has not had any follow up since initial diagnosis. No medications taken prior to arrival for symptoms.   Past Medical History:  Diagnosis Date  . Depression   . Diabetes mellitus without complication ([place])   . Hepatitis C   . Hypertension     There are no active problems to display for this patient.   History reviewed. No pertinent surgical history.     Home Medications    Prior to Admission medications   Medication Sig Start Date End Date Taking? Authorizing Provider  Doxylamine Succinate, Sleep, (SLEEP AID PO) Take 2-3 tablets by mouth every 4 (four) hours as needed (for sleep).   Yes  Historical Provider, MD  ibuprofen (ADVIL,MOTRIN) 200 MG tablet Take 400 mg by mouth every 4 (four) hours as needed for fever, headache, mild pain, moderate pain or cramping.   Yes Historical Provider, MD  insulin NPH-regular Human (NOVOLIN 70/30) (70-30) 100 UNIT/ML injection Inject 30 Units into the skin 2 (two) times daily with a meal. 12/07/14  Yes Jola Schmidt, MD  metFORMIN (GLUCOPHAGE) 500 MG tablet Take 500 mg by mouth 2 (two) times daily with a meal.   Yes Historical Provider, MD    Family History History reviewed. No pertinent family history.  Social History Social History  Substance Use Topics  . Smoking status: Current Every Day Smoker    Packs/day: 1.00    Years: 40.00  . Smokeless tobacco: Not on file  . Alcohol use Yes     Comment: couple beers/day     Allergies   Review of patient's allergies indicates no known allergies.   Review of Systems Review of Systems  Constitutional: Positive for unexpected weight change.  HENT: Negative for congestion.   Eyes: Negative for visual disturbance.  Respiratory: Negative for cough and shortness of breath.   Cardiovascular: Positive for leg swelling. Negative for chest pain and palpitations.  Gastrointestinal: Positive for abdominal pain and nausea. Negative for constipation, diarrhea and vomiting.  Genitourinary: Positive for flank pain. Negative for dysuria.  Musculoskeletal: Negative for back pain.  Skin: Negative for color change.  Neurological: Negative for weakness.     Physical  Exam Updated Vital Signs BP 127/93   Pulse 80   Temp 97.6 F (36.4 C) (Oral)   Resp 20   SpO2 100%   Physical Exam  Constitutional: He is oriented to person, place, and time.  Cachectic. NAD  HENT:  Head: Normocephalic and atraumatic.  Cardiovascular: Normal rate, regular rhythm, normal heart sounds and intact distal pulses.  Exam reveals no gallop and no friction rub.   No murmur heard. Pulmonary/Chest: Effort normal and breath  sounds normal. No respiratory distress. He has no wheezes.  Abdominal:  Abdomen distended. Mild generalized tenderness to palpation with left flank tenderness as well. Bowel sounds normal.   Musculoskeletal: He exhibits edema (Bilateral LE).  Neurological: He is alert and oriented to person, place, and time.  Skin: Skin is warm and dry.  Nursing note and vitals reviewed.    ED Treatments / Results  Labs (all labs ordered are listed, but only abnormal results are displayed) Labs Reviewed  COMPREHENSIVE METABOLIC PANEL - Abnormal; Notable for the following:       Result Value   Sodium 120 (*)    Chloride 86 (*)    Glucose, Bld 196 (*)    Calcium 8.4 (*)    Total Protein 8.2 (*)    Albumin 2.7 (*)    AST 186 (*)    ALT 73 (*)    Alkaline Phosphatase 333 (*)    Total Bilirubin 1.9 (*)    All other components within normal limits  CBC - Abnormal; Notable for the following:    RBC 4.13 (*)    HCT 37.7 (*)    All other components within normal limits  HEPATIC FUNCTION PANEL - Abnormal; Notable for the following:    Total Protein 8.2 (*)    Albumin 2.7 (*)    AST 186 (*)    ALT 73 (*)    Alkaline Phosphatase 333 (*)    Total Bilirubin 1.9 (*)    Bilirubin, Direct 1.0 (*)    All other components within normal limits  LIPASE, BLOOD  URINALYSIS, ROUTINE W REFLEX MICROSCOPIC (NOT AT [facility])    EKG  EKG Interpretation None       Radiology Dg Chest 2 View  Result Date: 11/25/2015 CLINICAL DATA:  Bilateral lower extremity swelling for 2 weeks. EXAM: CHEST  2 VIEW COMPARISON:  None. FINDINGS: Asymmetric elevation right hemidiaphragm noted. The lungs are clear wiithout focal pneumonia, edema, pneumothorax or pleural effusion. The cardiopericardial silhouette is within normal limits for size. The visualized bony structures of the thorax are intact. Telemetry leads overlie the chest. IMPRESSION: Asymmetric elevation right hemidiaphragm. No acute cardiopulmonary findings.  Electronically Signed   By: Misty Stanley M.D.   On: 11/25/2015 12:33   Ct Abdomen Pelvis W Contrast  Result Date: 11/25/2015 CLINICAL DATA:  Abdominal swelling for the last week. Left flank pain accompanied by dysuria. EXAM: CT ABDOMEN AND PELVIS WITH CONTRAST TECHNIQUE: Multidetector CT imaging of the abdomen and pelvis was performed using the standard protocol following bolus administration of intravenous contrast. CONTRAST:  164mL ISOVUE-300 IOPAMIDOL (ISOVUE-300) INJECTION 61% COMPARISON:  None. FINDINGS: Lower chest:  Unremarkable. Hepatobiliary: Multiple mass lesions are evident within the liver, involving the right liver and medial segment left liver. One of the dominant lesions measures up to 7.5 cm in diameter common fall in the inferior right liver. This is associated with occlusive thrombus in the right portal venous system. Liver contour is slightly nodular in this patient with a reported history of  hepatitis-C infection. There is some trace intrahepatic biliary duct dilatation around the dominant central liver mass. Small calcified gallstones noted. No extrahepatic biliary duct dilatation. Pancreas: No focal mass lesion. No dilatation of the main duct. No intraparenchymal cyst. No peripancreatic edema. Spleen: No splenomegaly. No focal mass lesion. Adrenals/Urinary Tract: No adrenal nodule or mass. Kidneys are unremarkable. No evidence for hydroureter. The urinary bladder appears normal for the degree of distention. Stomach/Bowel: Small hiatal hernia. Stomach is nondistended. No gastric wall thickening. No evidence of outlet obstruction. Duodenum is normally positioned as is the ligament of Treitz. There are some mildly distended distal small bowel loops. Terminal ileum is normal. Appendix is normal. No gross colonic mass. No colonic wall thickening. No substantial diverticular change. Vascular/Lymphatic: There is abdominal aortic atherosclerosis without aneurysm. As mentioned above, there is  occlusive thrombus in the right portal venous system. Left portal vein and superior mesenteric vein are prominent, but patent. The splenic vein is patent. Recanalization of the paraumbilical vein is associated with paraesophageal varices, features compatible with portal venous hypertension. There is no gastrohepatic or hepatoduodenal ligament lymphadenopathy. No intraperitoneal or retroperitoneal lymphadenopathy. No pelvic sidewall lymphadenopathy. Reproductive: The prostate gland and seminal vesicles have normal imaging features. Other: Large volume ascites evident. Musculoskeletal: Bone windows reveal no worrisome lytic or sclerotic osseous lesions. IMPRESSION: 1. Multiple large hepatic mass is involving the medial segment left liver and diffusely in the right liver are associated with occlusive thrombus of the right portal venous system with extension of thrombus into the confluence with the left portal vein. Main portal vein remains patent. Given the history of hepatitis C and the apparent nodular hepatic contour suggesting background cirrhosis, imaging features are consistent with multifocal hepatocellular carcinoma. 2. Large volume ascites. 3. Cholelithiasis. Electronically Signed   By: Misty Stanley M.D.   On: 11/25/2015 12:29    Procedures Procedures (including critical care time)  Medications Ordered in ED Medications  morphine 4 MG/ML injection 4 mg (4 mg Intravenous Given 11/25/15 1128)  iopamidol (ISOVUE-300) 61 % injection 100 mL (100 mLs Intravenous Contrast Given 11/25/15 1145)     Initial Impression / Assessment and Plan / ED Course  I have reviewed the triage vital signs and the nursing notes.  Pertinent labs & imaging results that were available during my care of the patient were reviewed by me and considered in my medical decision making (see chart for details).  Clinical Course   MURAD PAMPHILE is a cachectic chronically ill appearing male who presents to ED for 1-2 weeks of  worsening abdominal pain. Hx of Hep C. No PCP and no GI follow up. Hx of DM and receives DM meds from the [facility]. On exam, patient is afebrile and hemodynamically stable, mentating appropriately. Abdomen is distended and significant bilateral LE swelling. Abdomen with generalized mild ttp. Labs, CXR, and CT abd ordered.   Labs reviewed: Sodium 120, albumin 2.7, protein 8.2, Liver enzymes elevated.  CT abdomen with portal vein thrombosis and findings consistent with multifocal hepatocellular carcinoma. Ascites also present.   1:35 PM: Consulted GI, Dr. Collene Mares. She is aware of patient. Informed her that patient is currently in ED room 11, if room changes prior to evaluation, will update.   Hospitalist, Dr. Candiss Norse will admit.   Patient seen by and discussed with Dr. Ashok Cordia who agrees with treatment plan.   Final Clinical Impressions(s) / ED Diagnoses   Final diagnoses:  Swelling of lower extremity    New Prescriptions New Prescriptions   No  medications on file     [facility] Shy Guallpa, PA-C 11/25/15 [place], MD 11/25/15 [phone]

## 2015-11-25 NOTE — ED Notes (Signed)
Patient to ultrasound

## 2015-11-25 NOTE — Procedures (Signed)
Ultrasound-guided diagnostic and therapeutic paracentesis performed yielding 3 liters of bloody colored fluid. No immediate complications.  Peter Anthony E 3:55 PM 11/25/2015

## 2015-11-25 NOTE — ED Notes (Signed)
Patient reports swelling to bilateral lower legs for 2 weeks.  Patient also reports abdominal pain which began 1 week ago.  States that pain has worsened over the past week.  No BM x2 days.  Bilateral lower extremities noted to have +4 pitting edema.

## 2015-11-26 DIAGNOSIS — Z7189 Other specified counseling: Secondary | ICD-10-CM

## 2015-11-26 DIAGNOSIS — Z515 Encounter for palliative care: Secondary | ICD-10-CM

## 2015-11-26 DIAGNOSIS — C220 Liver cell carcinoma: Secondary | ICD-10-CM

## 2015-11-26 LAB — BASIC METABOLIC PANEL
ANION GAP: 7 (ref 5–15)
BUN: 10 mg/dL (ref 6–20)
CO2: 25 mmol/L (ref 22–32)
Calcium: 8.2 mg/dL — ABNORMAL LOW (ref 8.9–10.3)
Chloride: 93 mmol/L — ABNORMAL LOW (ref 101–111)
Creatinine, Ser: 0.67 mg/dL (ref 0.61–1.24)
GFR calc Af Amer: >60 mL/min (ref >60)
GFR calc non Af Amer: >60 mL/min (ref >60)
GLUCOSE: 140 mg/dL — AB (ref 65–99)
POTASSIUM: 4.3 mmol/L (ref 3.5–5.1)
Sodium: 125 mmol/L — ABNORMAL LOW (ref 135–145)

## 2015-11-26 LAB — CBC
HEMATOCRIT: 34 % — AB (ref 39.0–52.0)
Hemoglobin: 11.9 g/dL — ABNORMAL LOW (ref 13.0–17.0)
MCH: 31.9 pg (ref 26.0–34.0)
MCHC: 35 g/dL (ref 30.0–36.0)
MCV: 91.2 fL (ref 78.0–100.0)
Platelets: 300 10*3/uL (ref 150–400)
RBC: 3.73 MIL/uL — AB (ref 4.22–5.81)
RDW: 13.2 % (ref 11.5–15.5)
WBC: 10.1 10*3/uL (ref 4.0–10.5)

## 2015-11-26 LAB — GRAM STAIN

## 2015-11-26 LAB — HEMOGLOBIN A1C
Hgb A1c MFr Bld: 5.5 % (ref 4.8–5.6)
Mean Plasma Glucose: 111 mg/dL

## 2015-11-26 LAB — GLUCOSE, CAPILLARY
GLUCOSE-CAPILLARY: 171 mg/dL — AB (ref 65–99)
Glucose-Capillary: 116 mg/dL — ABNORMAL HIGH (ref 65–99)
Glucose-Capillary: 241 mg/dL — ABNORMAL HIGH (ref 65–99)
Glucose-Capillary: 117 mg/dL — ABNORMAL HIGH (ref 65–99)

## 2015-11-26 LAB — URINE CULTURE: CULTURE: NO GROWTH

## 2015-11-26 LAB — HEPARIN LEVEL (UNFRACTIONATED)
Heparin Unfractionated: 0.41 IU/mL (ref 0.30–0.70)
Heparin Unfractionated: 0.33 IU/mL (ref 0.30–0.70)

## 2015-11-26 MED ORDER — MORPHINE SULFATE (PF) 2 MG/ML IV SOLN
2.0000 mg | Freq: Once | INTRAVENOUS | Status: AC
  Administered 2015-11-26: 2 mg via INTRAVENOUS
  Filled 2015-11-26: qty 1

## 2015-11-26 MED ORDER — MORPHINE SULFATE (PF) 4 MG/ML IV SOLN
4.0000 mg | INTRAVENOUS | Status: DC | PRN
  Administered 2015-11-26 – 2015-11-28 (×6): 4 mg via INTRAVENOUS
  Filled 2015-11-26 (×7): qty 1

## 2015-11-26 MED ORDER — SODIUM CHLORIDE 0.9 % IV SOLN
INTRAVENOUS | Status: DC
  Administered 2015-11-26: 10:00:00 via INTRAVENOUS

## 2015-11-26 MED ORDER — MORPHINE SULFATE (PF) 2 MG/ML IV SOLN
2.0000 mg | INTRAVENOUS | Status: DC | PRN
  Administered 2015-11-26 (×2): 2 mg via INTRAVENOUS
  Filled 2015-11-26 (×2): qty 1

## 2015-11-26 MED ORDER — SODIUM CHLORIDE 0.9 % IV SOLN: INTRAVENOUS | Status: DC

## 2015-11-26 NOTE — Progress Notes (Signed)
ANTICOAGULATION CONSULT NOTE - Initial Consult  Pharmacy Consult for Heparin IV Indication: portal vein thrombosis  No Known Allergies  Patient Measurements: Height: 6' (182.9 cm) Weight: 130 lb 11.7 oz (59.3 kg) IBW/kg (Calculated) : 77.6 Heparin Dosing Weight: 60 kg  Vital Signs: Temp: 98 F (36.7 C) (08/08 0636) Temp Source: Oral (08/08 0636) BP: 133/86 (08/08 0636) Pulse Rate: 87 (08/08 0636)  Labs:  Recent Labs  11/25/15 1038 11/25/15 1415 11/25/15 1929 11/26/15 0214 11/26/15 0948  HGB 13.1  --   --  11.9*  --   HCT 37.7*  --   --  34.0*  --   PLT 321  --   --  300  --   APTT  --  34  --   --   --   LABPROT  --  16.3*  --   --   --   INR  --  1.30  --   --   --   HEPARINUNFRC  --   --  0.10* 0.41 0.33  CREATININE 0.84  --   --  0.67  --     Estimated Creatinine Clearance: 88.5 mL/min (by C-G formula based on SCr of 0.8 mg/dL).   Medical History: Past Medical History:  Diagnosis Date  . Depression   . Diabetes mellitus without complication ([place])   . Hepatitis C   . Hypertension     Medications:  Prescriptions Prior to Admission  Medication Sig Dispense Refill Last Dose  . Doxylamine Succinate, Sleep, (SLEEP AID PO) Take 2-3 tablets by mouth every 4 (four) hours as needed (for sleep).   11/24/2015 at Unknown time  . ibuprofen (ADVIL,MOTRIN) 200 MG tablet Take 400 mg by mouth every 4 (four) hours as needed for fever, headache, mild pain, moderate pain or cramping.   11/24/2015 at Unknown time  . insulin NPH-regular Human (NOVOLIN 70/30) (70-30) 100 UNIT/ML injection Inject 30 Units into the skin 2 (two) times daily with a meal. 10 mL 11 Past Month at Unknown time  . metFORMIN (GLUCOPHAGE) 500 MG tablet Take 500 mg by mouth 2 (two) times daily with a meal.   Past Month at Unknown time   Scheduled:  . feeding supplement (ENSURE ENLIVE)  237 mL Oral BID BM  . feeding supplement (PRO-STAT SUGAR FREE 64)  30 mL Oral TID WC  . fluconazole (DIFLUCAN) IV  100 mg  Intravenous Q24H  . insulin aspart  0-5 Units Subcutaneous QHS  . insulin aspart  0-9 Units Subcutaneous TID WC  . nystatin  5 mL Mouth/Throat QID  . sodium chloride flush  3 mL Intravenous Q12H    Assessment: 71 yoM with PMH DM, HepC, presents with abdominal pain and bilateral edema.  Found to have likely hepatocellular carcinoma with multiple liver masses and associated portal vein thrombosis. Heparin started on 8/7.  To change to Eliquis on 8/9 per MD's request.  Significant events: 8/7: paracentesis  Today, 11/26/2015: - heparin level remains therapeutic at 0.33 with current rate of 1500 units/hr - hgb down 11.9, plt wnl - no bleeding documented - plan for home hospice at discharge   Goal of Therapy: Heparin level 0.3-0.7 units/ml Monitor platelets by anticoagulation protocol: Yes  Plan: - continue heparin drip at 1500 units/hr - will plan on changing patient to Eliquis 10 mg bid x7 days, then 5 mg bid on 8/9 - monitor for s/s bleeding  Dia Sitter, PharmD, BCPS 11/26/2015 10:22 AM

## 2015-11-26 NOTE — Progress Notes (Signed)
Foley catheter removed as per Dr Candiss Norse verbal order. Pt tolerated procedure well. Urinal placed within reach.

## 2015-11-26 NOTE — Progress Notes (Signed)
ANTICOAGULATION CONSULT NOTE - Follow Up Consult  Pharmacy Consult for Heparin Indication: Portal vein thrombosis  No Known Allergies  Patient Measurements: Height: 6' (182.9 cm) Weight: 130 lb 11.7 oz (59.3 kg) IBW/kg (Calculated) : 77.6 Heparin Dosing Weight:   Vital Signs: Temp: 99 F (37.2 C) (08/07 2123) Temp Source: Oral (08/07 2123) BP: 131/78 (08/07 2123) Pulse Rate: 87 (08/07 2123)  Labs:  Recent Labs  11/25/15 1038 11/25/15 1415 11/25/15 1929 11/26/15 0214  HGB 13.1  --   --  11.9*  HCT 37.7*  --   --  34.0*  PLT 321  --   --  300  APTT  --  34  --   --   LABPROT  --  16.3*  --   --   INR  --  1.30  --   --   HEPARINUNFRC  --   --  0.10* 0.41  CREATININE 0.84  --   --  0.67    Estimated Creatinine Clearance: 88.5 mL/min (by C-G formula based on SCr of 0.8 mg/dL).   Medications:  Infusions:  . sodium chloride 75 mL/hr at 11/25/15 1827  . heparin 1,500 Units/hr (11/26/15 0447)    Assessment: Patient with heparin at goal.  No heparin issues noted.  Goal of Therapy:  Heparin level 0.3-0.7 units/ml Monitor platelets by anticoagulation protocol: Yes   Plan:  Continue heparin drip at current rate Recheck level at [place], [place] 11/26/2015,6:00 AM

## 2015-11-26 NOTE — Progress Notes (Signed)
PROGRESS NOTE                                                                                                                                                                                                             Patient Demographics:    Peter Anthony, is a 55 y.o. male, DOB - [birth year], [id number]  Admit date - 11/25/2015   Admitting Physician Thurnell Lose, MD  Outpatient Primary MD for the patient is No primary care provider on file.  LOS - 1  Chief Complaint  Patient presents with  . Abdominal Pain  . Flank Pain  . Foot Swelling       Brief Narrative   Peter Anthony  is a 55 y.o. male, History of type 2 diabetes mellitus on insulin, hepatitis C, who was doing fairly well until about 2 weeks ago he started experiencing sudden onset of abdominal pain radiating to his back with lower extremity swelling in both legs, he continued to have this symptom and came to the ER.  In the ER he was found to be severely cachectic, he had abdominal distention with lower extremity edema, CT scan suggestive of new diagnosis of hepatocellular carcinoma with portal vein thrombosis, he was also found to be hyponatremic. I was called to admit the patient.  She denies any previous history of hepatocellular carcinoma but does say that he had hepatitis C diagnosed several years ago he does not know how he contracted it, he does not have a PCP or her regular GI physician and he goes to the shelter clinic. He does condone to 20 pounds of unintentional weight loss in the last 1 month.    Subjective:    Peter Anthony today has, No headache, No chest pain, Mild generalized abdominal pain radiating to back at times - No Nausea, No new weakness tingling or numbness, No Cough - SOB.     Assessment  & Plan :     1. Abdominal pain due to portal vein thrombosis with new diagnosis of most likely hepatocellular carcinoma. This likely has caused  ascites with lower extremity edema- he is currently on heparin drip, seen by oncology who have said that patient likely has 6 months to live with extremely poor prognosis, per oncology palliative care has been involved. Had discussed the case with GI physician Dr. Collene Mares who  recommends nothing further as she has nothing to offer due to his poor prognosis.  He underwent therapeutic and diagnostic paracentesis on 11/25/2015 for fluid removal and 3 L of bloody noninfected ascites fluid was removed, this likely was malignant as well, cytology pending, goal at this time will be gentle medical treatment, if stable will transition him to oral Eliquis for portal vein thrombosis. AFB is pending, palliative care to evaluate, likely will be discharged with home hospice in the next few days.  2. DM type II. ISS  CBG (last 3)   Recent Labs  11/25/15 1728 11/25/15 2103 11/26/15 0758  GLUCAP 81 138* 116*    Lab Results  Component Value Date   HGBA1C 5.5 11/25/2015     3. History of hep C. Supportive care only at this time.  4. Severe cachexia and severe protein calorie malnutrition. Will place on protein supplement, likely due to #1 above, will also check HIV.  5. Oropharyngeal candidiasis. Diflucan per pharmacy, nystatin swish and swallow. Dysphagia has now improved placed on, modified diet.  6.  Hyponatremia with dehydration. Improving with IV fluids continue.     Family Communication  :  None present  Code Status :  Full for now we will talk about DO NOT RESUSCITATE  Diet : Heart healthy  Disposition Plan  :  Stay inpatient for another 1-2 days  Consults  : GI Dr. Bernerd Limbo to offer, IR, oncology, palliative care  Procedures  :    Ultrasound-guided paracentesis with 3 L of bloody fluid removed on 11/25/2015  DVT Prophylaxis  :    Heparin   Lab Results  Component Value Date   PLT 300 11/26/2015    Inpatient Medications  Scheduled Meds: . feeding supplement (ENSURE  ENLIVE)  237 mL Oral BID BM  . feeding supplement (PRO-STAT SUGAR FREE 64)  30 mL Oral TID WC  . fluconazole (DIFLUCAN) IV  100 mg Intravenous Q24H  . insulin aspart  0-5 Units Subcutaneous QHS  . insulin aspart  0-9 Units Subcutaneous TID WC  . nystatin  5 mL Mouth/Throat QID  . sodium chloride flush  3 mL Intravenous Q12H   Continuous Infusions: . heparin 1,500 Units/hr (11/26/15 0447)   PRN Meds:.HYDROcodone-acetaminophen, morphine injection, ondansetron **OR** ondansetron (ZOFRAN) IV  Antibiotics  :    Anti-infectives    Start     Dose/Rate Route Frequency Ordered Stop   11/26/15 1800  fluconazole (DIFLUCAN) IVPB 100 mg     100 mg 50 mL/hr over 60 Minutes Intravenous Every 24 hours 11/25/15 1740     11/25/15 1800  fluconazole (DIFLUCAN) IVPB 200 mg     200 mg 100 mL/hr over 60 Minutes Intravenous  Once 11/25/15 1740 11/25/15 1921         Objective:   Vitals:   11/25/15 1723 11/25/15 2123 11/26/15 0452 11/26/15 0636  BP:  131/78  133/86  Pulse:  87  87  Resp:  19  20  Temp:  99 F (37.2 C)  98 F (36.7 C)  TempSrc:  Oral  Oral  SpO2:  100%  100%  Weight:   59.3 kg (130 lb 11.7 oz)   Height: 6' (1.829 m)       Wt Readings from Last 3 Encounters:  11/26/15 59.3 kg (130 lb 11.7 oz)  12/07/14 72.6 kg (160 lb)     Intake/Output Summary (Last 24 hours) at 11/26/15 0911 Last data filed at 11/26/15 0600  Gross per 24 hour  Intake  1250.75 ml  Output             2800 ml  Net         -1549.25 ml     Physical Exam  Awake Alert, Oriented X 3, No new F.N deficits, Normal affect [place].AT,PERRAL Supple Neck,No JVD, No cervical lymphadenopathy appriciated.  Symmetrical Chest wall movement, Good air movement bilaterally, CTAB RRR,No Gallops,Rubs or new Murmurs, No Parasternal Heave +ve B.Sounds, Abd Soft but distended, No tenderness, No organomegaly appriciated, No rebound - guarding or rigidity. No Cyanosis, Clubbing or edema, No new Rash or bruise        Data Review:    CBC  Recent Labs Lab 11/25/15 1038 11/26/15 0214  WBC 9.5 10.1  HGB 13.1 11.9*  HCT 37.7* 34.0*  PLT 321 300  MCV 91.3 91.2  MCH 31.7 31.9  MCHC 34.7 35.0  RDW 13.0 13.2    Chemistries   Recent Labs Lab 11/25/15 1038 11/26/15 0214  NA 120* 125*  K 4.0 4.3  CL 86* 93*  CO2 25 25  GLUCOSE 196* 140*  BUN 12 10  CREATININE 0.84 0.67  CALCIUM 8.4* 8.2*  AST 186*  186*  --   ALT 73*  73*  --   ALKPHOS 333*  333*  --   BILITOT 1.9*  1.9*  --    ------------------------------------------------------------------------------------------------------------------ No results for input(s): CHOL, HDL, LDLCALC, TRIG, CHOLHDL, LDLDIRECT in the last 72 hours.  Lab Results  Component Value Date   HGBA1C 5.5 11/25/2015   ------------------------------------------------------------------------------------------------------------------ No results for input(s): TSH, T4TOTAL, T3FREE, THYROIDAB in the last 72 hours.  Invalid input(s): FREET3 ------------------------------------------------------------------------------------------------------------------ No results for input(s): VITAMINB12, FOLATE, FERRITIN, TIBC, IRON, RETICCTPCT in the last 72 hours.  Coagulation profile  Recent Labs Lab 11/25/15 1415  INR 1.30    No results for input(s): DDIMER in the last 72 hours.  Cardiac Enzymes No results for input(s): CKMB, TROPONINI, MYOGLOBIN in the last 168 hours.  Invalid input(s): CK ------------------------------------------------------------------------------------------------------------------ No results found for: BNP  Micro Results Recent Results (from the past 240 hour(s))  Gram stain     Status: None   Collection Time: 11/25/15  3:18 PM  Result Value Ref Range Status   Specimen Description FLUID PERITONEAL  Final   Special Requests NONE  Final   Gram Stain   Final    FEW WBC PRESENT,BOTH PMN AND MONONUCLEAR NO ORGANISMS SEEN Performed at  [facility]    Report Status 11/26/2015 FINAL  Final    Radiology Reports Dg Chest 2 View  Result Date: 11/25/2015 CLINICAL DATA:  Bilateral lower extremity swelling for 2 weeks. EXAM: CHEST  2 VIEW COMPARISON:  None. FINDINGS: Asymmetric elevation right hemidiaphragm noted. The lungs are clear wiithout focal pneumonia, edema, pneumothorax or pleural effusion. The cardiopericardial silhouette is within normal limits for size. The visualized bony structures of the thorax are intact. Telemetry leads overlie the chest. IMPRESSION: Asymmetric elevation right hemidiaphragm. No acute cardiopulmonary findings. Electronically Signed   By: Misty Stanley M.D.   On: 11/25/2015 12:33   Ct Abdomen Pelvis W Contrast  Result Date: 11/25/2015 CLINICAL DATA:  Abdominal swelling for the last week. Left flank pain accompanied by dysuria. EXAM: CT ABDOMEN AND PELVIS WITH CONTRAST TECHNIQUE: Multidetector CT imaging of the abdomen and pelvis was performed using the standard protocol following bolus administration of intravenous contrast. CONTRAST:  175mL ISOVUE-300 IOPAMIDOL (ISOVUE-300) INJECTION 61% COMPARISON:  None. FINDINGS: Lower chest:  Unremarkable. Hepatobiliary: Multiple mass lesions are evident  within the liver, involving the right liver and medial segment left liver. One of the dominant lesions measures up to 7.5 cm in diameter common fall in the inferior right liver. This is associated with occlusive thrombus in the right portal venous system. Liver contour is slightly nodular in this patient with a reported history of hepatitis-C infection. There is some trace intrahepatic biliary duct dilatation around the dominant central liver mass. Small calcified gallstones noted. No extrahepatic biliary duct dilatation. Pancreas: No focal mass lesion. No dilatation of the main duct. No intraparenchymal cyst. No peripancreatic edema. Spleen: No splenomegaly. No focal mass lesion. Adrenals/Urinary Tract: No adrenal  nodule or mass. Kidneys are unremarkable. No evidence for hydroureter. The urinary bladder appears normal for the degree of distention. Stomach/Bowel: Small hiatal hernia. Stomach is nondistended. No gastric wall thickening. No evidence of outlet obstruction. Duodenum is normally positioned as is the ligament of Treitz. There are some mildly distended distal small bowel loops. Terminal ileum is normal. Appendix is normal. No gross colonic mass. No colonic wall thickening. No substantial diverticular change. Vascular/Lymphatic: There is abdominal aortic atherosclerosis without aneurysm. As mentioned above, there is occlusive thrombus in the right portal venous system. Left portal vein and superior mesenteric vein are prominent, but patent. The splenic vein is patent. Recanalization of the paraumbilical vein is associated with paraesophageal varices, features compatible with portal venous hypertension. There is no gastrohepatic or hepatoduodenal ligament lymphadenopathy. No intraperitoneal or retroperitoneal lymphadenopathy. No pelvic sidewall lymphadenopathy. Reproductive: The prostate gland and seminal vesicles have normal imaging features. Other: Large volume ascites evident. Musculoskeletal: Bone windows reveal no worrisome lytic or sclerotic osseous lesions. IMPRESSION: 1. Multiple large hepatic mass is involving the medial segment left liver and diffusely in the right liver are associated with occlusive thrombus of the right portal venous system with extension of thrombus into the confluence with the left portal vein. Main portal vein remains patent. Given the history of hepatitis C and the apparent nodular hepatic contour suggesting background cirrhosis, imaging features are consistent with multifocal hepatocellular carcinoma. 2. Large volume ascites. 3. Cholelithiasis. Electronically Signed   By: Misty Stanley M.D.   On: 11/25/2015 12:29   US Paracentesis  Result Date: 11/25/2015 INDICATION: Abdominal pain,  CT scan findings of ascites EXAM: ULTRASOUND GUIDED DIAGNOSTIC AND THERAPEUTIC PARACENTESIS MEDICATIONS: 1% lidocaine. COMPLICATIONS: None immediate. PROCEDURE: Informed written consent was obtained from the patient after a discussion of the risks, benefits and alternatives to treatment. A timeout was performed prior to the initiation of the procedure. Initial ultrasound scanning demonstrates a moderate amount of ascites within the right upper abdominal quadrant. The right upper abdomen was prepped and draped in the usual sterile fashion. 1% lidocaine was used for local anesthesia. Following this, a 19 gauge, 7-cm, Yueh catheter was introduced. An ultrasound image was saved for documentation purposes. The paracentesis was performed. The catheter was removed and a dressing was applied. The patient tolerated the procedure well without immediate post procedural complication. FINDINGS: A total of approximately 3 L of bloody fluid was removed. Samples were sent to the laboratory as requested by the clinical team. IMPRESSION: Successful ultrasound-guided paracentesis yielding 3 liters of peritoneal fluid. This was the limit set by the ordering provider. Read by: Saverio Danker, PA-C Electronically Signed   By: Aletta Edouard M.D.   On: 11/25/2015 15:52    Time Spent in minutes  30   Maysoon Lozada K M.D on 11/26/2015 at 9:11 AM  Between 7am to 7pm - Pager - [phone]  After 7pm go to [email or web address] - password [facility]  [facility] -  Office  [phone]

## 2015-11-26 NOTE — Evaluation (Signed)
Clinical/Bedside Swallow Evaluation Patient Details  Name: Peter Anthony MRN: [id number] Date of Birth: 1960/06/25  Today's Date: 11/26/2015 Time: SLP Start Time (ACUTE ONLY): 0930 SLP Stop Time (ACUTE ONLY): 0949 SLP Time Calculation (min) (ACUTE ONLY): 19 min  Past Medical History:  Past Medical History:  Diagnosis Date  . Depression   . Diabetes mellitus without complication ([place])   . Hepatitis C   . Hypertension    Past Surgical History: History reviewed. No pertinent surgical history. HPI:  55 yo male adm to [facility] with portal vein thrombosis.  Pt with s/p paracentesis yesterday.  He has liver mets with h/o hepatitis C - was diagnosed with thrush.     Assessment / Plan / Recommendation Clinical Impression  Functional oropharyngeal swallow present, no focal CN deficits.  Oral cavity is clear- no indication of thrush- whitish patches at this time.  Pt observed consuming graham cracker, Boost Breeze - timely swallow and clear throughout.  Recommend continue regular/thin diet.  Of note, pt reports 30 pound weight loss within 6 weeks.  Advised him to consume multipe small meals due to fluid retention in abdomen likely contributing to early satiety.  Provided pt written xerostomia compensations.  No SlP follow up indicated. Thanks for this order.     Aspiration Risk  No limitations    Diet Recommendation Regular;Thin liquid   Liquid Administration via: Cup;Straw Medication Administration: Whole meds with liquid Supervision: Patient able to self feed Compensations: Slow rate;Small sips/bites Postural Changes: Remain upright for at least 30 minutes after po intake;Seated upright at 90 degrees    Other  Recommendations Oral Care Recommendations: Oral care BID   Follow up Recommendations    none   Frequency and Duration   n/a         Prognosis   n/a     Swallow Study   General Date of Onset: 11/26/15 HPI: 55 yo male adm to [facility] with portal vein thrombosis.  Pt with s/p  paracentesis yesterday.  He has liver mets with h/o hepatitis C - was diagnosed with thrush.   Type of Study: Bedside Swallow Evaluation Diet Prior to this Study: Regular;Thin liquids Temperature Spikes Noted: No Respiratory Status: Room air History of Recent Intubation: No Behavior/Cognition: Alert;Cooperative;Pleasant mood Oral Cavity Assessment: Within Functional Limits Oral Care Completed by SLP: No Oral Cavity - Dentition: Adequate natural dentition Vision: Functional for self-feeding Self-Feeding Abilities: Able to feed self Patient Positioning: Upright in bed Baseline Vocal Quality: Normal Volitional Cough: Weak    Oral/Motor/Sensory Function Overall Oral Motor/Sensory Function: Within functional limits   Ice Chips Ice chips: Not tested   Thin Liquid Thin Liquid: Within functional limits Presentation: Straw    Nectar Thick Nectar Thick Liquid: Not tested   Honey Thick Honey Thick Liquid: Not tested   Puree Puree: Not tested   Solid   GO   Solid: Within functional limits Presentation: [place], [place] [facility] SLP [phone]

## 2015-11-26 NOTE — Consult Note (Signed)
Consultation Note Date: 11/26/2015   Patient Name: Peter Anthony  DOB: 03-01-1961  MRN: [id number]  Age / Sex: 55 y.o., male  PCP: No primary care provider on file. Referring Physician: Thurnell Lose, MD  Reason for Consultation: Establishing goals of care  HPI/Patient Profile: 55 y.o. male  admitted on 11/25/2015    Clinical Assessment and Goals of Care:  Peter Anthony  is a 55 y.o. male, History of type 2 diabetes mellitus on insulin, hepatitis C, who lives alone in [place], [place] and who was doing fairly well until about 2 weeks ago he started experiencing sudden onset of abdominal pain radiating to his back with lower extremity swelling in both legs, he continued to have this symptom and came to the ER on 11-25-15.    In the ER he was found to be severely cachectic, he had abdominal distention with lower extremity edema, CT scan suggestive of new diagnosis of hepatocellular carcinoma with portal vein thrombosis, he was also found to be hyponatremic. He stated at the time of admission that he had lost 20 lbs unintentionally in the last 1 month, he also stated that he noticed jaundice, he did not have a PCP or a regular GI physician. He had been diagnosed with Hep C a few years ago.   He has since been admitted to the Hospitalist service, he has been given IVF, heparin drip. He has been seen by Oncology. He has undergone paracentesis, 3L ascitic fluid has been removed. AFP levels have been sent and are pending. Oncology's final recommendations are to consider Hospice, estimated prognosis likely less than 6 months.   Palliative consult placed for code status, goals of care discussions and to facilitate addition of hospice as an extra layer of support.   The patient is resting in bed, he appears much older than stated age. There is no family at the bedside. I introduced myself and palliative care as  follows:  Palliative medicine is specialized medical care for people living with serious illness. It focuses on providing relief from the symptoms and stress of a serious illness. The goal is to improve quality of life for both the patient and the family.  Patient states, " all of this is happening too fast. I know I'm sick, but I Peter Anthony think I'm dying." Initiated gentle discussions with patient. Explained to him how Hospice can help. Patient values his independence and dignity. He wishes to maintain them as long as possible. I re assured him that Hospice can help him with that. He wishes to remain full code for now. He elects his sister to be his designated Psychiatrist. He has served in [employer] and has lived in various states in the [place]. He lives alone.      See recommendations below, thank you for the consult.   NEXT OF KIN  patient elects his sister Peter Anthony to be his designated HCPOA agent [phone] RECOMMENDATIONS    full code for now, patient wishes to  continue to think more about his overall condition, he is open to considering DNR DNI in the near future.  He is agreeable to home with hospice on d/c Add Morphine IV PRN May need to d/c norco and give him oral morphine on d/c May need another paracentesis before d/c just as a symptom management measure, for his abdominal distension and back pain.  Chaplain consult to complete advanced directives  Code Status/Advance Care Planning:  Full code    Symptom Management:    see above   Palliative Prophylaxis:     Bowel Regimen  Psycho-social/Spiritual:   Desire for further Chaplaincy support:yes  Additional Recommendations: Education on Hospice  Prognosis:   < 6 months  Discharge Planning: Home with Hospice      Primary Diagnoses: Present on Admission: . Portal vein thrombosis secondary to [place] invasion   I have reviewed the medical record, interviewed the patient and family, and examined the  patient. The following aspects are pertinent.  Past Medical History:  Diagnosis Date  . Depression   . Diabetes mellitus without complication ([place])   . Hepatitis C   . Hypertension    Social History   Social History  . Marital status: Legally Separated    Spouse name: N/A  . Number of children: N/A  . Years of education: N/A   Social History Main Topics  . Smoking status: Current Every Day Smoker    Packs/day: 1.00    Years: 40.00  . Smokeless tobacco: Never Used  . Alcohol use Yes     Comment: couple beers/day  . Drug use: No  . Sexual activity: Not Currently   Other Topics Concern  . None   Social History Narrative  . None   History reviewed. No pertinent family history. Scheduled Meds: . feeding supplement (ENSURE ENLIVE)  237 mL Oral BID BM  . feeding supplement (PRO-STAT SUGAR FREE 64)  30 mL Oral TID WC  . fluconazole (DIFLUCAN) IV  100 mg Intravenous Q24H  . insulin aspart  0-5 Units Subcutaneous QHS  . insulin aspart  0-9 Units Subcutaneous TID WC  . nystatin  5 mL Mouth/Throat QID  . sodium chloride flush  3 mL Intravenous Q12H   Continuous Infusions: . heparin 1,500 Units/hr (11/26/15 0447)   PRN Meds:.HYDROcodone-acetaminophen, morphine injection, ondansetron **OR** ondansetron (ZOFRAN) IV Medications Prior to Admission:  Prior to Admission medications   Medication Sig Start Date End Date Taking? Authorizing Provider  Doxylamine Succinate, Sleep, (SLEEP AID PO) Take 2-3 tablets by mouth every 4 (four) hours as needed (for sleep).   Yes Historical Provider, MD  ibuprofen (ADVIL,MOTRIN) 200 MG tablet Take 400 mg by mouth every 4 (four) hours as needed for fever, headache, mild pain, moderate pain or cramping.   Yes Historical Provider, MD  insulin NPH-regular Human (NOVOLIN 70/30) (70-30) 100 UNIT/ML injection Inject 30 Units into the skin 2 (two) times daily with a meal. 12/07/14  Yes Jola Schmidt, MD  metFORMIN (GLUCOPHAGE) 500 MG tablet Take 500 mg by  mouth 2 (two) times daily with a meal.   Yes Historical Provider, MD   No Known Allergies Review of Systems + for back pain, distension in stomach  Physical Exam Weak frail Cachectic Muscle wasting Clubbing digits S1 S2 Clear Abdomen distended Awake alert oriented  Vital Signs: BP 133/86 (BP Location: Right Arm)   Pulse 87   Temp 98 F (36.7 C) (Oral)   Resp 20   Ht 6' (1.829 m)   Wt 59.3  kg (130 lb 11.7 oz)   SpO2 100%   BMI 17.73 kg/m  Pain Assessment: 0-10   Pain Score: 2    SpO2: SpO2: 100 % O2 Device:SpO2: 100 % O2 Flow Rate: .   IO: Intake/output summary:  Intake/Output Summary (Last 24 hours) at 11/26/15 0908 Last data filed at 11/26/15 0600  Gross per 24 hour  Intake          1250.75 ml  Output             2800 ml  Net         -1549.25 ml    LBM: Last BM Date: 11/22/15 Baseline Weight: Weight: 60.2 kg (132 lb 12.8 oz) Most recent weight: Weight: 59.3 kg (130 lb 11.7 oz)     Palliative Assessment/Data:   Flowsheet Rows   Flowsheet Row Most Recent Value  Intake Tab  Referral Department  Hospitalist  Unit at Time of Referral  Oncology Unit  Palliative Care Primary Diagnosis  Cancer  Palliative Care Type  New Palliative care  Reason for referral  Pain, Clarify Goals of Care  Date first seen by Palliative Care  11/26/15  Clinical Assessment  Palliative Performance Scale Score  40%  Pain Max last 24 hours  7  Pain Min Last 24 hours  5  Dyspnea Max Last 24 Hours  5  Dyspnea Min Last 24 hours  4  Nausea Max Last 24 Hours  5  Nausea Min Last 24 Hours  4  Psychosocial & Spiritual Assessment  Palliative Care Outcomes  Patient/Family meeting held?  Yes  Who was at the meeting?  patient who is decisional.   Palliative Care Outcomes  Clarified goals of care  Palliative Care follow-up planned  Yes, Home      Time In:  8am Time Out:  9 am  Time Total:  60 min  Greater than 50%  of this time was spent counseling and coordinating care related to  the above assessment and plan.  Signed by: Loistine Chance, MD  [phone]  Please contact Palliative Medicine Team phone at [phone] for questions and concerns.  For individual provider: See Amion password [facility]

## 2015-11-26 NOTE — Care Management Note (Signed)
Case Management Note  Patient Details  Name: KINAN GIACHETTI MRN: [id number] Date of Birth: Mar 15, 1961  Subjective/Objective:54 y/o m admitted w/Portal vein thrombosis. From home. No insurance or pcp. Provided w/pcp listing-chose [facility]-appt set @ [facility](see f/u provider section), patient informed he can go to [facility] for meds to be filled-[address]received referral for 30day free trial discount coupon-xarelto/eliquis-will provide coupon once it is determined which med patient will d/c on.Noted palliative following. Patient plans to d/c home.                  Action/Plan:d/c plan home.   Expected Discharge Date:   (UNKNOWN)               Expected Discharge Plan:  Home/Self Care  In-House Referral:     Discharge [facility]  Post Acute Care Choice:    Choice offered to:     DME Arranged:    DME Agency:     HH Arranged:    HH Agency:     Status of Service:  In process, will continue to follow  If discussed at Long Length of Stay Meetings, dates discussed:    Additional Comments:  Dessa Phi, RN 11/26/2015, 3:00 PM

## 2015-11-27 ENCOUNTER — Inpatient Hospital Stay (HOSPITAL_COMMUNITY): Payer: Medicaid Other

## 2015-11-27 DIAGNOSIS — E43 Unspecified severe protein-calorie malnutrition: Secondary | ICD-10-CM | POA: Insufficient documentation

## 2015-11-27 LAB — COMPREHENSIVE METABOLIC PANEL
ALBUMIN: 2.7 g/dL — AB (ref 3.5–5.0)
ALK PHOS: 229 U/L — AB (ref 38–126)
ALT: 64 U/L — AB (ref 17–63)
AST: 160 U/L — AB (ref 15–41)
Anion gap: 6 (ref 5–15)
BILIRUBIN TOTAL: 1.7 mg/dL — AB (ref 0.3–1.2)
BUN: 13 mg/dL (ref 6–20)
CALCIUM: 8.3 mg/dL — AB (ref 8.9–10.3)
CO2: 26 mmol/L (ref 22–32)
CREATININE: 0.61 mg/dL (ref 0.61–1.24)
Chloride: 95 mmol/L — ABNORMAL LOW (ref 101–111)
GFR calc Af Amer: >60 mL/min (ref >60)
GFR calc non Af Amer: >60 mL/min (ref >60)
GLUCOSE: 119 mg/dL — AB (ref 65–99)
Potassium: 4.8 mmol/L (ref 3.5–5.1)
SODIUM: 127 mmol/L — AB (ref 135–145)
TOTAL PROTEIN: 7.1 g/dL (ref 6.5–8.1)

## 2015-11-27 LAB — CBC
HEMATOCRIT: 32.1 % — AB (ref 39.0–52.0)
HEMOGLOBIN: 11 g/dL — AB (ref 13.0–17.0)
MCH: 31.7 pg (ref 26.0–34.0)
MCHC: 34.3 g/dL (ref 30.0–36.0)
MCV: 92.5 fL (ref 78.0–100.0)
Platelets: 397 10*3/uL (ref 150–400)
RBC: 3.47 MIL/uL — AB (ref 4.22–5.81)
RDW: 13.5 % (ref 11.5–15.5)
WBC: 13.7 10*3/uL — ABNORMAL HIGH (ref 4.0–10.5)

## 2015-11-27 LAB — AFP TUMOR MARKER

## 2015-11-27 LAB — HEMOGLOBIN A1C
Hgb A1c MFr Bld: 5.5 % (ref 4.8–5.6)
Mean Plasma Glucose: 111 mg/dL

## 2015-11-27 LAB — GLUCOSE, CAPILLARY
GLUCOSE-CAPILLARY: 193 mg/dL — AB (ref 65–99)
GLUCOSE-CAPILLARY: 102 mg/dL — AB (ref 65–99)
Glucose-Capillary: 176 mg/dL — ABNORMAL HIGH (ref 65–99)
Glucose-Capillary: 136 mg/dL — ABNORMAL HIGH (ref 65–99)

## 2015-11-27 LAB — HIV ANTIBODY (ROUTINE TESTING W REFLEX): HIV Screen 4th Generation wRfx: NONREACTIVE

## 2015-11-27 MED ORDER — DOCUSATE SODIUM 100 MG PO CAPS
200.0000 mg | ORAL_CAPSULE | Freq: Two times a day (BID) | ORAL | Status: DC
  Administered 2015-11-27: 200 mg via ORAL
  Filled 2015-11-27 (×2): qty 2

## 2015-11-27 MED ORDER — APIXABAN 5 MG PO TABS: 10.0000 mg | ORAL_TABLET | Freq: Two times a day (BID) | ORAL | Status: DC

## 2015-11-27 MED ORDER — SODIUM CHLORIDE 0.9 % IV BOLUS (SEPSIS)
250.0000 mL | Freq: Once | INTRAVENOUS | Status: AC
  Administered 2015-11-28: 250 mL via INTRAVENOUS

## 2015-11-27 MED ORDER — ALBUMIN HUMAN 25 % IV SOLN
50.0000 g | Freq: Once | INTRAVENOUS | Status: AC | PRN
  Administered 2015-11-27: 50 g via INTRAVENOUS
  Filled 2015-11-27 (×2): qty 200

## 2015-11-27 MED ORDER — SODIUM CHLORIDE 0.9 % IV SOLN
8.0000 mg | Freq: Once | INTRAVENOUS | Status: DC
  Filled 2015-11-27: qty 4

## 2015-11-27 MED ORDER — SODIUM CHLORIDE 0.9 % IV SOLN
INTRAVENOUS | Status: AC
  Administered 2015-11-28: via INTRAVENOUS

## 2015-11-27 MED ORDER — BOOST / RESOURCE BREEZE PO LIQD
1.0000 | Freq: Three times a day (TID) | ORAL | Status: DC
Start: 2015-11-27 — End: 2015-11-28
  Administered 2015-11-27 (×2): 1 via ORAL

## 2015-11-27 MED ORDER — APIXABAN 5 MG PO TABS: 5.0000 mg | ORAL_TABLET | Freq: Two times a day (BID) | ORAL | Status: DC

## 2015-11-27 MED ORDER — APIXABAN 5 MG PO TABS
10.0000 mg | ORAL_TABLET | Freq: Once | ORAL | Status: AC
  Administered 2015-11-27: 10 mg via ORAL
  Filled 2015-11-27: qty 2

## 2015-11-27 MED ORDER — MORPHINE SULFATE (PF) 4 MG/ML IV SOLN
4.0000 mg | Freq: Four times a day (QID) | INTRAVENOUS | Status: DC
  Administered 2015-11-27 – 2015-11-28 (×6): 4 mg via INTRAVENOUS
  Filled 2015-11-27 (×5): qty 1

## 2015-11-27 MED ORDER — BISACODYL 5 MG PO TBEC
5.0000 mg | DELAYED_RELEASE_TABLET | Freq: Every day | ORAL | Status: DC | PRN
  Administered 2015-11-27: 5 mg via ORAL
  Filled 2015-11-27: qty 1

## 2015-11-27 MED ORDER — ADULT MULTIVITAMIN W/MINERALS CH
1.0000 | ORAL_TABLET | Freq: Every day | ORAL | Status: DC
  Administered 2015-11-27: 1 via ORAL
  Filled 2015-11-27: qty 1

## 2015-11-27 NOTE — Procedures (Signed)
Ultrasound-guided diagnostic and therapeutic paracentesis performed yielding 0.15 liters of bloody complex fluid. No immediate complications.  Unable to drain more fluid as fluid is very complex, likely clot.  Peter Anthony E 2:55 PM 11/27/2015

## 2015-11-27 NOTE — Progress Notes (Signed)
Patient c/o abdominal pain. Pt assessed abd found to be distended. Pt stated that he had not urinated since the previous day. Bladder scan completed, result > 999. Hospitalist provider contacted, order received to place foley catheter. Pt tolerated well.

## 2015-11-27 NOTE — Progress Notes (Addendum)
Daily Progress Note   Patient Name: Peter Anthony       Date: 11/27/2015 DOB: 1961/02/02  Age: 55 y.o. MRN#: [id number] Attending Physician: Thurnell Lose, MD Primary Care Physician: No primary care provider on file. Admit Date: 11/25/2015  Reason for Consultation/Follow-up: Establishing goals of care, Hospice Evaluation and Pain control Life limiting illness: hepatocellular carcinoma.   Subjective:  awake, in pain. Complains of abdominal distension and pain. Generalized, also has back pain.  Appears weak, did not rest well overnight No family at the bedside.  18 mg IV Morphine used in the past 24 hours, see below.  Length of Stay: 2  Current Medications: Scheduled Meds:  . feeding supplement (ENSURE ENLIVE)  237 mL Oral BID BM  . feeding supplement (PRO-STAT SUGAR FREE 64)  30 mL Oral TID WC  . fluconazole (DIFLUCAN) IV  100 mg Intravenous Q24H  . insulin aspart  0-5 Units Subcutaneous QHS  . insulin aspart  0-9 Units Subcutaneous TID WC  .  morphine injection  4 mg Intravenous Q6H  . nystatin  5 mL Mouth/Throat QID  . sodium chloride flush  3 mL Intravenous Q12H    Continuous Infusions: . sodium chloride    . heparin 1,500 Units/hr (11/26/15 2139)    PRN Meds: albumin human, HYDROcodone-acetaminophen, morphine injection, ondansetron **OR** ondansetron (ZOFRAN) IV  Physical Exam         Weak appearing cachectic AA gentleman Mildly dyspneic Abdominal diffuse pain and distension No edema Awake alert in moderate distress  Oriented, answers questions appropriately  Vital Signs: BP 133/90 (BP Location: Right Arm)   Pulse (!) 105   Temp 98.5 F (36.9 C) (Oral)   Resp 18   Ht 6' (1.829 m)   Wt 61.4 kg (135 lb 5.8 oz)   SpO2 99%   BMI 18.36 kg/m  SpO2: SpO2: 99 % O2  Device: O2 Device: Not Delivered O2 Flow Rate:    Intake/output summary:  Intake/Output Summary (Last 24 hours) at 11/27/15 0831 Last data filed at 11/27/15 0700  Gross per 24 hour  Intake           2889.5 ml  Output              400 ml  Net  2489.5 ml   LBM: Last BM Date: 11/25/15 Baseline Weight: Weight: 60.2 kg (132 lb 12.8 oz) Most recent weight: Weight: 61.4 kg (135 lb 5.8 oz)       Palliative Assessment/Data:    Flowsheet Rows   Flowsheet Row Most Recent Value  Intake Tab  Referral Department  Hospitalist  Unit at Time of Referral  Oncology Unit  Palliative Care Primary Diagnosis  Cancer  Date Notified  11/25/15  Palliative Care Type  Return patient Palliative Care  Reason for referral  Pain, Clarify Goals of Care, Counsel Regarding Hospice  Date of Admission  11/25/15  Date first seen by Palliative Care  11/27/15  # of days Palliative referral response time  1 Day(s)  # of days IP prior to Palliative referral  0  Clinical Assessment  Palliative Performance Scale Score  30%  Pain Max last 24 hours  8  Pain Min Last 24 hours  5  Dyspnea Max Last 24 Hours  4  Dyspnea Min Last 24 hours  3  Nausea Max Last 24 Hours  4  Nausea Min Last 24 Hours  3  Anxiety Max Last 24 Hours  6  Anxiety Min Last 24 Hours  5  Psychosocial & Spiritual Assessment  Palliative Care Outcomes  Patient/Family meeting held?  Yes  Who was at the meeting?  patient who is decisional.   Palliative Care Outcomes  Clarified goals of care  Palliative Care follow-up planned  Yes, Home  Palliative Care Follow-up Reason  Clarify goals of care      Patient Active Problem List   Diagnosis Date Noted  . Encounter for palliative care   . Goals of care, counseling/discussion   . 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 Hepatocellular carcinoma ([place]) 11/25/2015  . Hepatitis C 11/25/2015  . DM2 (diabetes mellitus, type 2) ([place]) 11/25/2015    Palliative Care  Assessment & Plan   Patient Profile:    Assessment:  primary hepatocellular carcinoma Ascites Abdominal pain Back pain Portal vein thrombosis Escalating pain needs  Recommendations/Plan:  Add scheduled IV Morphine  Continue Morphine IV PRN  Code status discussions undertaken in great detail: patient states he does not want to suffer any more than he has to. He states he has full understanding of his rapidly declining condition. He is asking for repeat paracentesis. Hence, after answering all his questions, we will establish DNR DNI.   Discussed with Dr Candiss Norse, completely agree with repeat paracentesis, consider Aspira drain placement as anticipate recurrent paracentesis.   Patient is rapidly declining, watch disease trajectory, doubt he will be able to go home safely, even with hospice support, may have to consider residential hospice towards the end of this hospitalization, absed on how he does, for the next 24 hours. We will continue to follow and help guide appropriate decision making.   Code Status:    Code Status Orders        Start     Ordered   11/27/15 0829  Do not attempt resuscitation (DNR)  Continuous    Question Answer Comment  In the event of cardiac or respiratory ARREST Do not call a "code blue"   In the event of cardiac or respiratory ARREST Do not perform Intubation, CPR, defibrillation or ACLS   In the event of cardiac or respiratory ARREST Use medication by any route, position, wound care, and other measures to relive pain and suffering. May use oxygen, suction and manual treatment of airway obstruction  as needed for comfort.      11/27/15 [id number]    Code Status History    Date Active Date Inactive Code Status Order ID Comments User Context   11/25/2015  5:03 PM 11/27/2015  8:28 AM Full Code [id number]  Thurnell Lose, MD Inpatient       Prognosis:   < 2 weeks  Discharge Planning:  To Be Determined  Care plan was discussed with  Patient, Dr Candiss Norse.    No family present at the bedside this am.   Thank you for allowing the Palliative Medicine Team to assist in the care of this patient.   Time In: 8 Time Out: 835 Total Time 35 Prolonged Time Billed  no       Greater than 50%  of this time was spent counseling and coordinating care related to the above assessment and plan.  Loistine Chance, MD [phone]  Please contact Palliative Medicine Team phone at [phone] for questions and concerns. Password [email or web address]

## 2015-11-27 NOTE — Progress Notes (Signed)
Patient had a very large amount of emesis. MD paged with update.  Will continue to monitor.

## 2015-11-27 NOTE — Progress Notes (Signed)
Patient had a couple more episodes of emesis. MD ordered a 1x order of Zofran 8mg  and diet changed to clears.  Patient refused Zofran saying " I think I drank too much cranberry juice." Educated patient that he has Zofran ordered prn if nausea returns.  Will continue to monitor.

## 2015-11-27 NOTE — Progress Notes (Signed)
ANTICOAGULATION CONSULT NOTE - Initial Consult  Pharmacy Consult for Heparin IV --> Eliquis Indication: portal vein thrombosis  No Known Allergies  Patient Measurements: Height: 6' (182.9 cm) Weight: 135 lb 5.8 oz (61.4 kg) IBW/kg (Calculated) : 77.6 Heparin Dosing Weight: 60 kg  Vital Signs: Temp: 98.5 F (36.9 C) (08/09 0526) Temp Source: Oral (08/09 0526) BP: 133/90 (08/09 0526) Pulse Rate: 105 (08/09 0526)  Labs:  Recent Labs  11/25/15 1038 11/25/15 1415 11/25/15 1929 11/26/15 0214 11/26/15 0948 11/27/15 0542  HGB 13.1  --   --  11.9*  --  11.0*  HCT 37.7*  --   --  34.0*  --  32.1*  PLT 321  --   --  300  --  397  APTT  --  34  --   --   --   --   LABPROT  --  16.3*  --   --   --   --   INR  --  1.30  --   --   --   --   HEPARINUNFRC  --   --  0.10* 0.41 0.33  --   CREATININE 0.84  --   --  0.67  --  0.61    Estimated Creatinine Clearance: 91.7 mL/min (by C-G formula based on SCr of 0.8 mg/dL).   Medical History: Past Medical History:  Diagnosis Date  . Depression   . Diabetes mellitus without complication ([place])   . Hepatitis C   . Hypertension     Medications:  Prescriptions Prior to Admission  Medication Sig Dispense Refill Last Dose  . Doxylamine Succinate, Sleep, (SLEEP AID PO) Take 2-3 tablets by mouth every 4 (four) hours as needed (for sleep).   11/24/2015 at Unknown time  . ibuprofen (ADVIL,MOTRIN) 200 MG tablet Take 400 mg by mouth every 4 (four) hours as needed for fever, headache, mild pain, moderate pain or cramping.   11/24/2015 at Unknown time  . insulin NPH-regular Human (NOVOLIN 70/30) (70-30) 100 UNIT/ML injection Inject 30 Units into the skin 2 (two) times daily with a meal. 10 mL 11 Past Month at Unknown time  . metFORMIN (GLUCOPHAGE) 500 MG tablet Take 500 mg by mouth 2 (two) times daily with a meal.   Past Month at Unknown time   Scheduled:  . feeding supplement (ENSURE ENLIVE)  237 mL Oral BID BM  . feeding supplement (PRO-STAT  SUGAR FREE 64)  30 mL Oral TID WC  . fluconazole (DIFLUCAN) IV  100 mg Intravenous Q24H  . insulin aspart  0-5 Units Subcutaneous QHS  . insulin aspart  0-9 Units Subcutaneous TID WC  . nystatin  5 mL Mouth/Throat QID  . sodium chloride flush  3 mL Intravenous Q12H    Assessment: 51 yoM with PMH DM, HepC, presents with abdominal pain and bilateral edema.  Found to have likely hepatocellular carcinoma with multiple liver masses and associated portal vein thrombosis. Heparin started on 8/7.  To change to Eliquis on 8/9 per MD's request.  Plan for home hospice at discharge  Significant events: 8/7: paracentesis 8/9: paracentesis  Today, 11/27/2015: - hgb low but stable today, plt ok - no bleeding documented - heparin drip d/ced at 11:30AM for paracentesis   Goal of Therapy: Heparin level 0.3-0.7 units/ml Monitor platelets by anticoagulation protocol: Yes  Plan: - s/p thoracentesis, spoke to Dr. Candiss Norse-- ok to resume AC back --> transition heparin to Eliquis now - Eliquis 10 mg bid x7 days, then 5 mg bid  on 8/9 - monitor for s/s bleeding  Dia Sitter, PharmD, BCPS 11/27/2015 7:38 AM

## 2015-11-27 NOTE — Progress Notes (Signed)
PROGRESS NOTE                                                                                                                                                                                                             Patient Demographics:    Peter Anthony, is a 55 y.o. male, DOB - 09-12-1960, KM:[id number]  Admit date - 11/25/2015   Admitting Physician Thurnell Lose, MD  Outpatient Primary MD for the patient is No primary care provider on file.  LOS - 2  Chief Complaint  Patient presents with  . Abdominal Pain  . Flank Pain  . Foot Swelling       Brief Narrative   Peter Anthony  is a 55 y.o. male, History of type 2 diabetes mellitus on insulin, hepatitis C, who was doing fairly well until about 2 weeks ago he started experiencing sudden onset of abdominal pain radiating to his back with lower extremity swelling in both legs, he continued to have this symptom and came to the ER.  In the ER he was found to be severely cachectic, he had abdominal distention with lower extremity edema, CT scan suggestive of new diagnosis of hepatocellular carcinoma with portal vein thrombosis, he was also found to be hyponatremic. I was called to admit the patient.  She denies any previous history of hepatocellular carcinoma but does say that he had hepatitis C diagnosed several years ago he does not know how he contracted it, he does not have a PCP or her regular GI physician and he goes to the shelter clinic. He does condone to 20 pounds of unintentional weight loss in the last 1 month.    Subjective:    Peter Anthony today has, No headache, No chest pain, Mild generalized abdominal pain radiating to back at times - No Nausea, No new weakness tingling or numbness, No Cough - SOB.     Assessment  & Plan :     1. Abdominal pain due to portal vein thrombosis with new diagnosis of most likely hepatocellular carcinoma. This likely has caused  ascites with lower extremity edema- he is currently on heparin drip, seen by oncology who have said that patient likely has 6 months to live with extremely poor prognosis, per oncology palliative care has been involved. Had discussed the case with GI physician Dr. Collene Mares who  recommends nothing further as she has nothing to offer due to his poor prognosis.  He underwent therapeutic and diagnostic paracentesis on 11/25/2015 for fluid removal and 3 L of bloody noninfected ascites fluid was removed, this likely was malignant as well, cytology pending, will order another therapeutic paracentesis on 11/27/2015 with IV albumin as abdomen is distended again, goal at this time will be gentle medical treatment, if stable will transition him to oral Eliquis for portal vein thrombosis. HIV serology and AFP pending, palliative care to evaluate, likely will be discharged with home hospice in the next few days.  2. DM type II. ISS  CBG (last 3)   Recent Labs  11/26/15 1646 11/26/15 2133 11/27/15 0730  GLUCAP 117* 171* 176*    Lab Results  Component Value Date   HGBA1C 5.5 11/25/2015     3. History of hep C. Supportive care only at this time.  4. Severe cachexia and severe protein calorie malnutrition. Will place on protein supplement, likely due to #1 above, will also Pending HIV.  5. Oropharyngeal candidiasis. Diflucan per pharmacy, nystatin swish and swallow. Dysphagia has now improved placed on, modified diet. Candidiasis much improved.  6.  Hyponatremia with dehydration. Improving with IV fluids continue.     Family Communication  :  None present  Code Status :  He understands that his prognosis is extremely poor and life expectancy is less than 6 months, he understand that she is probably actively dying but he still wants to continue full code for now. He is open to continue discussions about home hospice.  Diet : Heart healthy  Disposition Plan  :  Stay inpatient for another 1-2  days  Consults  : GI Dr. Bernerd Limbo to offer, IR, oncology, palliative care  Procedures  :    Ultrasound-guided paracentesis with 3 L of bloody fluid removed on 11/25/2015  DVT Prophylaxis  :    Heparin   Lab Results  Component Value Date   PLT 397 11/27/2015    Inpatient Medications  Scheduled Meds: . feeding supplement (ENSURE ENLIVE)  237 mL Oral BID BM  . feeding supplement (PRO-STAT SUGAR FREE 64)  30 mL Oral TID WC  . fluconazole (DIFLUCAN) IV  100 mg Intravenous Q24H  . insulin aspart  0-5 Units Subcutaneous QHS  . insulin aspart  0-9 Units Subcutaneous TID WC  . nystatin  5 mL Mouth/Throat QID  . sodium chloride flush  3 mL Intravenous Q12H   Continuous Infusions: . sodium chloride 75 mL/hr at 11/26/15 1008  . heparin 1,500 Units/hr (11/26/15 2139)   PRN Meds:.albumin human, HYDROcodone-acetaminophen, morphine injection, ondansetron **OR** ondansetron (ZOFRAN) IV  Antibiotics  :    Anti-infectives    Start     Dose/Rate Route Frequency Ordered Stop   11/26/15 1800  fluconazole (DIFLUCAN) IVPB 100 mg     100 mg 50 mL/hr over 60 Minutes Intravenous Every 24 hours 11/25/15 1740     11/25/15 1800  fluconazole (DIFLUCAN) IVPB 200 mg     200 mg 100 mL/hr over 60 Minutes Intravenous  Once 11/25/15 1740 11/25/15 1921         Objective:   Vitals:   11/26/15 0636 11/26/15 1327 11/26/15 2128 11/27/15 0526  BP: 133/86 129/81 129/87 133/90  Pulse: 87 89 99 (!) 105  Resp: 20 16 17 18   Temp: 98 F (36.7 C) 98.2 F (36.8 C) 98 F (36.7 C) 98.5 F (36.9 C)  TempSrc: Oral Oral Oral Oral  SpO2: 100%  99% 100% 99%  Weight:    61.4 kg (135 lb 5.8 oz)  Height:        Wt Readings from Last 3 Encounters:  11/27/15 61.4 kg (135 lb 5.8 oz)  12/07/14 72.6 kg (160 lb)     Intake/Output Summary (Last 24 hours) at 11/27/15 0815 Last data filed at 11/27/15 0700  Gross per 24 hour  Intake           2889.5 ml  Output              400 ml  Net           2489.5 ml      Physical Exam  Awake Alert, Oriented X 3, No new F.N deficits, Normal affect [place].AT,PERRAL Supple Neck,No JVD, No cervical lymphadenopathy appriciated.  Symmetrical Chest wall movement, Good air movement bilaterally, CTAB RRR,No Gallops,Rubs or new Murmurs, No Parasternal Heave +ve B.Sounds, Abd Soft but distended, No tenderness, No organomegaly appriciated, No rebound - guarding or rigidity. No Cyanosis, Clubbing or edema, No new Rash or bruise       Data Review:    CBC  Recent Labs Lab 11/25/15 1038 11/26/15 0214 11/27/15 0542  WBC 9.5 10.1 13.7*  HGB 13.1 11.9* 11.0*  HCT 37.7* 34.0* 32.1*  PLT 321 300 397  MCV 91.3 91.2 92.5  MCH 31.7 31.9 31.7  MCHC 34.7 35.0 34.3  RDW 13.0 13.2 13.5    Chemistries   Recent Labs Lab 11/25/15 1038 11/26/15 0214 11/27/15 0542  NA 120* 125* 127*  K 4.0 4.3 4.8  CL 86* 93* 95*  CO2 25 25 26   GLUCOSE 196* 140* 119*  BUN 12 10 13   CREATININE 0.84 0.67 0.61  CALCIUM 8.4* 8.2* 8.3*  AST 186*  186*  --  160*  ALT 73*  73*  --  64*  ALKPHOS 333*  333*  --  229*  BILITOT 1.9*  1.9*  --  1.7*   ------------------------------------------------------------------------------------------------------------------ No results for input(s): CHOL, HDL, LDLCALC, TRIG, CHOLHDL, LDLDIRECT in the last 72 hours.  Lab Results  Component Value Date   HGBA1C 5.5 11/25/2015   ------------------------------------------------------------------------------------------------------------------ No results for input(s): TSH, T4TOTAL, T3FREE, THYROIDAB in the last 72 hours.  Invalid input(s): FREET3 ------------------------------------------------------------------------------------------------------------------ No results for input(s): VITAMINB12, FOLATE, FERRITIN, TIBC, IRON, RETICCTPCT in the last 72 hours.  Coagulation profile  Recent Labs Lab 11/25/15 1415  INR 1.30    No results for input(s): DDIMER in the last 72  hours.  Cardiac Enzymes No results for input(s): CKMB, TROPONINI, MYOGLOBIN in the last 168 hours.  Invalid input(s): CK ------------------------------------------------------------------------------------------------------------------ No results found for: BNP  Micro Results Recent Results (from the past 240 hour(s))  Urine culture     Status: None   Collection Time: 11/25/15  1:15 PM  Result Value Ref Range Status   Specimen Description URINE, RANDOM  Final   Special Requests NONE  Final   Culture NO GROWTH Performed at [facility]   Final   Report Status 11/26/2015 FINAL  Final  Culture, body fluid-bottle     Status: None (Preliminary result)   Collection Time: 11/25/15  3:18 PM  Result Value Ref Range Status   Specimen Description FLUID PERITONEAL  Final   Special Requests BOTTLES DRAWN AEROBIC AND ANAEROBIC 10CC  Final   Culture   Final    NO GROWTH < 24 HOURS Performed at [facility]    Report Status PENDING  Incomplete  Gram stain  Status: None   Collection Time: 11/25/15  3:18 PM  Result Value Ref Range Status   Specimen Description FLUID PERITONEAL  Final   Special Requests NONE  Final   Gram Stain   Final    FEW WBC PRESENT,BOTH PMN AND MONONUCLEAR NO ORGANISMS SEEN Performed at [facility]    Report Status 11/26/2015 FINAL  Final    Radiology Reports Dg Chest 2 View  Result Date: 11/25/2015 CLINICAL DATA:  Bilateral lower extremity swelling for 2 weeks. EXAM: CHEST  2 VIEW COMPARISON:  None. FINDINGS: Asymmetric elevation right hemidiaphragm noted. The lungs are clear wiithout focal pneumonia, edema, pneumothorax or pleural effusion. The cardiopericardial silhouette is within normal limits for size. The visualized bony structures of the thorax are intact. Telemetry leads overlie the chest. IMPRESSION: Asymmetric elevation right hemidiaphragm. No acute cardiopulmonary findings. Electronically Signed   By: Misty Stanley M.D.   On:  11/25/2015 12:33   Ct Abdomen Pelvis W Contrast  Result Date: 11/25/2015 CLINICAL DATA:  Abdominal swelling for the last week. Left flank pain accompanied by dysuria. EXAM: CT ABDOMEN AND PELVIS WITH CONTRAST TECHNIQUE: Multidetector CT imaging of the abdomen and pelvis was performed using the standard protocol following bolus administration of intravenous contrast. CONTRAST:  146mL ISOVUE-300 IOPAMIDOL (ISOVUE-300) INJECTION 61% COMPARISON:  None. FINDINGS: Lower chest:  Unremarkable. Hepatobiliary: Multiple mass lesions are evident within the liver, involving the right liver and medial segment left liver. One of the dominant lesions measures up to 7.5 cm in diameter common fall in the inferior right liver. This is associated with occlusive thrombus in the right portal venous system. Liver contour is slightly nodular in this patient with a reported history of hepatitis-C infection. There is some trace intrahepatic biliary duct dilatation around the dominant central liver mass. Small calcified gallstones noted. No extrahepatic biliary duct dilatation. Pancreas: No focal mass lesion. No dilatation of the main duct. No intraparenchymal cyst. No peripancreatic edema. Spleen: No splenomegaly. No focal mass lesion. Adrenals/Urinary Tract: No adrenal nodule or mass. Kidneys are unremarkable. No evidence for hydroureter. The urinary bladder appears normal for the degree of distention. Stomach/Bowel: Small hiatal hernia. Stomach is nondistended. No gastric wall thickening. No evidence of outlet obstruction. Duodenum is normally positioned as is the ligament of Treitz. There are some mildly distended distal small bowel loops. Terminal ileum is normal. Appendix is normal. No gross colonic mass. No colonic wall thickening. No substantial diverticular change. Vascular/Lymphatic: There is abdominal aortic atherosclerosis without aneurysm. As mentioned above, there is occlusive thrombus in the right portal venous system. Left  portal vein and superior mesenteric vein are prominent, but patent. The splenic vein is patent. Recanalization of the paraumbilical vein is associated with paraesophageal varices, features compatible with portal venous hypertension. There is no gastrohepatic or hepatoduodenal ligament lymphadenopathy. No intraperitoneal or retroperitoneal lymphadenopathy. No pelvic sidewall lymphadenopathy. Reproductive: The prostate gland and seminal vesicles have normal imaging features. Other: Large volume ascites evident. Musculoskeletal: Bone windows reveal no worrisome lytic or sclerotic osseous lesions. IMPRESSION: 1. Multiple large hepatic mass is involving the medial segment left liver and diffusely in the right liver are associated with occlusive thrombus of the right portal venous system with extension of thrombus into the confluence with the left portal vein. Main portal vein remains patent. Given the history of hepatitis C and the apparent nodular hepatic contour suggesting background cirrhosis, imaging features are consistent with multifocal hepatocellular carcinoma. 2. Large volume ascites. 3. Cholelithiasis. Electronically Signed   By: Randall Hiss  Tery Sanfilippo M.D.   On: 11/25/2015 12:29   US Paracentesis  Result Date: 11/25/2015 INDICATION: Abdominal pain, CT scan findings of ascites EXAM: ULTRASOUND GUIDED DIAGNOSTIC AND THERAPEUTIC PARACENTESIS MEDICATIONS: 1% lidocaine. COMPLICATIONS: None immediate. PROCEDURE: Informed written consent was obtained from the patient after a discussion of the risks, benefits and alternatives to treatment. A timeout was performed prior to the initiation of the procedure. Initial ultrasound scanning demonstrates a moderate amount of ascites within the right upper abdominal quadrant. The right upper abdomen was prepped and draped in the usual sterile fashion. 1% lidocaine was used for local anesthesia. Following this, a 19 gauge, 7-cm, Yueh catheter was introduced. An ultrasound image was  saved for documentation purposes. The paracentesis was performed. The catheter was removed and a dressing was applied. The patient tolerated the procedure well without immediate post procedural complication. FINDINGS: A total of approximately 3 L of bloody fluid was removed. Samples were sent to the laboratory as requested by the clinical team. IMPRESSION: Successful ultrasound-guided paracentesis yielding 3 liters of peritoneal fluid. This was the limit set by the ordering provider. Read by: Saverio Danker, PA-C Electronically Signed   By: Aletta Edouard M.D.   On: 11/25/2015 15:52    Time Spent in minutes  30   SINGH,PRASHANT K M.D on 11/27/2015 at 8:15 AM  Between 7am to 7pm - Pager - [phone]  After 7pm go to [email or web address] - password [facility]  [facility] -  Office  [phone]

## 2015-11-27 NOTE — Progress Notes (Addendum)
Initial Nutrition Assessment  DOCUMENTATION CODES:   Severe malnutrition in context of chronic illness, Underweight  INTERVENTION:  - Will d/c Ensure Enlive per pt preference. - Will order Boost Breeze TID, each supplement provides 250 kcal and 9 grams of protein - Will order daily multivitamin with minerals. - RD will continue to monitor for additional needs.  NUTRITION DIAGNOSIS:   Increased nutrient needs related to catabolic illness, cancer and cancer related treatments as evidenced by estimated needs.  GOAL:   Patient will meet greater than or equal to 90% of their needs  MONITOR:   PO intake, Weight trends, Labs, I & O's  REASON FOR ASSESSMENT:   Malnutrition Screening Tool  ASSESSMENT:   55 y.o. male, history of type 2 diabetes mellitus on insulin, hepatitis C, who was doing fairly well until about 2 weeks ago he started experiencing sudden onset of abdominal pain radiating to his back with lower extremity swelling in both legs. In the ER he was found to be severely cachectic, he had abdominal distention with lower extremity edema, CT scan suggestive of new diagnosis of hepatocellular carcinoma with portal vein thrombosis, he was also found to be hyponatremic. She denies any previous history of hepatocellular carcinoma but does say that he had hepatitis C diagnosed several years ago he does not know how he contracted it, he does not have a PCP or her regular GI physician and he goes to the shelter clinic. He does condone to 20 pounds of unintentional weight loss in the last 1 month.  Pt seen for MST. BMI indicates underweight status. Pt is currently out of the room and no family/visitors present at this time. He is currently ordered Ensure Enlive BID. SLP saw pt yesterday and recommended regular consistency diet with thin liquids. Palliative Care following pt at this time.  Unable to do physical assessment at this time but will complete at follow-up; pt likely meets criteria  for malnutrition but unable to state without discussion about PO intakes PTA and physical assessment. Limited weight hx in the chart shows 25 lb weight loss (16% body weight) in the past 1 year which is not significant for time frame. Per H&P, pt reported 20 lb weight loss in the past 1 month. Based on CBW, this indicates 13% body weight loss which is significant for time frame.  Will continue to monitor for needs. Medications reviewed. Labs reviewed; CBGs: 117-241 mg/dL since 8/8 AM, Na: 127 mmol/L, Cl: 95 mmol/L, Ca: 8.3 mg/dL, LFTs elevated.   ADDENDUM: RD was able to talk with pt after he returned to the floor. RN reports that pt does not like Ensure Enlive but that he does like [employer]; will change orders as outlined above. Physical assessment shows severe muscle and fat wasting with prominent abdomen. Pt states that for 10 days PTA he was experiencing abdominal pain and bloating which he thought was constipation. D/t this he states that he would not eat until he felt very hungry as to not exacerbate abdominal pain. He states that he ran out of prescription medications and was self-medicating x10 days with alcohol, sleeping pills, and Advil in an attempt to relieve abdominal pain.    Diet Order:  Diet Carb Modified Fluid consistency: Thin; Room service appropriate? Yes  Skin:  Reviewed, no issues  Last BM:  8/7  Height:   Ht Readings from Last 1 Encounters:  11/25/15 6' (1.829 m)    Weight:   Wt Readings from Last 1 Encounters:  11/27/15 135  lb 5.8 oz (61.4 kg)    Ideal Body Weight:  80.91 kg  BMI:  Body mass index is 18.36 kg/m.  Estimated Nutritional Needs:   Kcal:  1850-2050  Protein:  90-100 grams  Fluid:  1.8-2 L/day  EDUCATION NEEDS:   No education needs identified at this time   Jarome Matin, MS, RD, LDN Inpatient Clinical Dietitian Pager # [phone] After hours/weekend pager # [phone]

## 2015-11-27 NOTE — Progress Notes (Signed)
Patient urine output low, foley inplace, last documented output 3pm, Notified Kirby-NP ordered to remove foley and I/O cath, I/O cath done but no output. NP notified again. Will continue to monitor patient.

## 2015-11-28 ENCOUNTER — Other Ambulatory Visit (HOSPITAL_COMMUNITY): Payer: Self-pay

## 2015-11-28 LAB — CBC
HEMATOCRIT: 23.2 % — AB (ref 39.0–52.0)
HEMOGLOBIN: 7.9 g/dL — AB (ref 13.0–17.0)
MCH: 32.1 pg (ref 26.0–34.0)
MCHC: 34.1 g/dL (ref 30.0–36.0)
MCV: 94.3 fL (ref 78.0–100.0)
Platelets: 427 10*3/uL — ABNORMAL HIGH (ref 150–400)
RBC: 2.46 MIL/uL — ABNORMAL LOW (ref 4.22–5.81)
RDW: 13.7 % (ref 11.5–15.5)
WBC: 23.2 10*3/uL — AB (ref 4.0–10.5)

## 2015-11-28 LAB — GLUCOSE, CAPILLARY
GLUCOSE-CAPILLARY: 105 mg/dL — AB (ref 65–99)
Glucose-Capillary: 96 mg/dL (ref 65–99)

## 2015-11-28 MED ORDER — SODIUM CHLORIDE 0.9 % IV BOLUS (SEPSIS)
250.0000 mL | Freq: Once | INTRAVENOUS | Status: AC
  Administered 2015-11-28: 250 mL via INTRAVENOUS

## 2015-11-28 MED ORDER — LORAZEPAM 2 MG/ML PO CONC: 1.0000 mg | Freq: Four times a day (QID) | ORAL | 0 refills | Status: AC | PRN

## 2015-11-28 MED ORDER — MORPHINE SULFATE (CONCENTRATE) 10 MG/0.5ML PO SOLN: 10.0000 mg | ORAL | 0 refills | Status: AC | PRN

## 2015-11-28 MED ORDER — PANTOPRAZOLE SODIUM 40 MG PO TBEC
40.0000 mg | DELAYED_RELEASE_TABLET | Freq: Two times a day (BID) | ORAL | Status: DC
  Administered 2015-11-28: 40 mg via ORAL
  Filled 2015-11-28: qty 1

## 2015-11-28 MED ORDER — PANTOPRAZOLE SODIUM 40 MG PO TBEC: 40.0000 mg | DELAYED_RELEASE_TABLET | Freq: Every day | ORAL | 0 refills | Status: AC

## 2015-11-28 MED ORDER — FLUCONAZOLE 100 MG PO TABS: 100.0000 mg | ORAL_TABLET | Freq: Every day | ORAL | 0 refills | Status: AC

## 2015-11-28 MED ORDER — ONDANSETRON 4 MG PO TBDP: 4.0000 mg | ORAL_TABLET | Freq: Three times a day (TID) | ORAL | 0 refills | Status: AC | PRN

## 2015-11-28 NOTE — Progress Notes (Signed)
Daily Progress Note   Patient Name: Peter Anthony       Date: 11/28/2015 DOB: 1960-12-05  Age: 55 y.o. MRN#: [id number] Attending Physician: Thurnell Lose, MD Primary Care Physician: No primary care provider on file. Admit Date: 11/25/2015  Reason for Consultation/Follow-up: Establishing goals of care, Hospice Evaluation and Pain control Life limiting illness: hepatocellular carcinoma.   Subjective:  awake, in pain. Complains of abdominal distension and pain. Generalized, also has back pain.  Appears weak, tired. Not able to get much fluid drained, not able to void urine.  No family at the bedside. He states his family will likely be coming in to see him later today, does not want [place] to call his sister.  See below   Length of Stay: 3  Current Medications: Scheduled Meds:  . docusate sodium  200 mg Oral BID  . feeding supplement  1 Container Oral TID BM  . feeding supplement (PRO-STAT SUGAR FREE 64)  30 mL Oral TID WC  . fluconazole (DIFLUCAN) IV  100 mg Intravenous Q24H  . insulin aspart  0-5 Units Subcutaneous QHS  . insulin aspart  0-9 Units Subcutaneous TID WC  .  morphine injection  4 mg Intravenous Q6H  . multivitamin with minerals  1 tablet Oral Daily  . nystatin  5 mL Mouth/Throat QID  . ondansetron (ZOFRAN) IV  8 mg Intravenous Once  . pantoprazole  40 mg Oral BID  . sodium chloride flush  3 mL Intravenous Q12H    Continuous Infusions: . sodium chloride Stopped (11/28/15 0758)    PRN Meds: bisacodyl, HYDROcodone-acetaminophen, morphine injection, ondansetron **OR** ondansetron (ZOFRAN) IV  Physical Exam         Weak appearing cachectic AA gentleman Mildly dyspneic Abdominal diffuse pain and distension No edema Awake alert in moderate distress  Oriented, answers  questions appropriately  Vital Signs: BP 105/69 (BP Location: Right Arm)   Pulse (!) 114   Temp 97.9 F (36.6 C) (Oral)   Resp 16   Ht 6' (1.829 m)   Wt 64.5 kg (142 lb 3.2 oz)   SpO2 95%   BMI 19.29 kg/m  SpO2: SpO2: 95 % O2 Device: O2 Device: Not Delivered O2 Flow Rate:    Intake/output summary:   Intake/Output Summary (Last 24 hours) at 11/28/15 0848 Last data filed  at 11/28/15 0732  Gross per 24 hour  Intake             2290 ml  Output              650 ml  Net             1640 ml   LBM: Last BM Date: 11/24/15 Baseline Weight: Weight: 60.2 kg (132 lb 12.8 oz) Most recent weight: Weight: 64.5 kg (142 lb 3.2 oz)       Palliative Assessment/Data:    Flowsheet Rows   Flowsheet Row Most Recent Value  Intake Tab  Referral Department  Hospitalist  Unit at Time of Referral  Oncology Unit  Palliative Care Primary Diagnosis  Cancer  Date Notified  11/25/15  Palliative Care Type  Return patient Palliative Care  Reason for referral  Pain, Clarify Goals of Care, Counsel Regarding Hospice  Date of Admission  11/25/15  Date first seen by Palliative Care  11/27/15  # of days Palliative referral response time  1 Day(s)  # of days IP prior to Palliative referral  0  Clinical Assessment  Palliative Performance Scale Score  30%  Pain Max last 24 hours  8  Pain Min Last 24 hours  5  Dyspnea Max Last 24 Hours  4  Dyspnea Min Last 24 hours  3  Nausea Max Last 24 Hours  4  Nausea Min Last 24 Hours  3  Anxiety Max Last 24 Hours  6  Anxiety Min Last 24 Hours  5  Psychosocial & Spiritual Assessment  Palliative Care Outcomes  Patient/Family meeting held?  Yes  Who was at the meeting?  patient who is decisional.   Palliative Care Outcomes  Clarified goals of care  Palliative Care follow-up planned  Yes, Home  Palliative Care Follow-up Reason  Clarify goals of care      Patient Active Problem List   Diagnosis Date Noted  . Protein-calorie malnutrition, severe 11/27/2015  .  Encounter for palliative care   . Goals of care, counseling/discussion   . Portal vein thrombosis secondary to [facility] invasion 11/25/2015  . Hepatocellular carcinoma ([place]) 11/25/2015  . Hepatitis C 11/25/2015  . DM2 (diabetes mellitus, type 2) ([place]) 11/25/2015    Palliative Care Assessment & Plan   Patient Profile:    Assessment:  primary hepatocellular carcinoma Ascites Abdominal pain Back pain Portal vein thrombosis Escalating pain needs  Recommendations/Plan:  Now on both scheduled IV Morphine and also to continue Morphine IV PRN  Code status now DNR DNI  Patient is rapidly declining, watching his disease trajectory, doubt he will be able to go home safely, even with hospice support, may have to consider residential hospice towards the end of this hospitalization, discussed frankly with patient this morning, he agrees to talk to Hospice today and is accepting of residential hospice.      Code Status:    Code Status Orders        Start     Ordered   11/27/15 0829  Do not attempt resuscitation (DNR)  Continuous    Question Answer Comment  In the event of cardiac or respiratory ARREST Do not call a "code blue"   In the event of cardiac or respiratory ARREST Do not perform Intubation, CPR, defibrillation or ACLS   In the event of cardiac or respiratory ARREST Use medication by any route, position, wound care, and other measures to relive pain and suffering. May use oxygen, suction and manual treatment  of airway obstruction as needed for comfort.      11/27/15 [id number]    Code Status History    Date Active Date Inactive Code Status Order ID Comments User Context   11/25/2015  5:03 PM 11/27/2015  8:28 AM Full Code [id number]  Thurnell Lose, MD Inpatient       Prognosis:   < 2 weeks  Discharge Planning:  ?residential hospice.   Care plan was discussed with  Patient,RN .  No family present at the bedside this am.   Thank you for allowing the Palliative Medicine Team  to assist in the care of this patient.   Time In: 8 Time Out: 835 Total Time 35 Prolonged Time Billed  no       Greater than 50%  of this time was spent counseling and coordinating care related to the above assessment and plan.  Loistine Chance, MD [phone]  Please contact Palliative Medicine Team phone at [phone] for questions and concerns. Password [email or web address]

## 2015-11-28 NOTE — Care Management Note (Signed)
Case Management Note  Patient Details  Name: Peter Anthony MRN: [id number] Date of Birth: [birth year]  Subjective/Objective:   CSW following for d/c residential hospice.                 Action/Plan:d/c residential hospice.   Expected Discharge Date:   (UNKNOWN)               Expected Discharge Plan:  [place]  In-House Referral:     Discharge [facility]  Post Acute Care Choice:    Choice offered to:     DME Arranged:    DME Agency:     HH Arranged:    [place] Agency:     Status of Service:  Completed, signed off  If discussed at [employer] of Stay Meetings, dates discussed:    Additional Comments:  Dessa Phi, RN 11/28/2015, 11:54 AM

## 2015-11-28 NOTE — Progress Notes (Signed)
[facility] called for transport.     Jenay Morici, LCSW [facility] Clinical Social Worker cell #: [phone]  

## 2015-11-28 NOTE — Progress Notes (Addendum)
Report given to Freddrick March, RN at [facility]. IV will be left in place at request of [facility]. Pt will be transport via [facility].

## 2015-11-28 NOTE — Progress Notes (Signed)
After the 1st 250cc NS  bolus ordered and given, still no urine,  I/O cath again with just drop of urine, Kirby-NP informed again, another 250cc NS bolus ordered, currently infusing, reported off to day shift nurse to F/U with plan of care.

## 2015-11-28 NOTE — Discharge Summary (Addendum)
[name] Clipper [id number] DOB: 06/26/1960 DOA: 11/25/2015  PCP: No primary care provider on file.  Admit date: 11/25/2015  Discharge date: 11/28/2015  Admitted From: Home   Disposition:  Residential hospice   Recommendations for Outpatient Follow-up:   Follow up with PCP in 1-2 weeks  PCP Please obtain BMP/CBC, 2 view CXR in 1week,  (see Discharge instructions)   PCP Please follow up on the following pending results: None   Home Health: None   Equipment/Devices: None  Consultations: Pall care, oncology Discharge Condition: guarded CODE STATUS: DO NOT RESUSCITATE   Diet Recommendation: Regular as tolerated   Chief Complaint  Patient presents with  . Abdominal Pain  . Flank Pain  . Foot Swelling     Brief history of present illness from the day of admission and additional interim summary    Peter Anthony a 55 y.o.male,History of type 2 diabetes mellitus on insulin, hepatitis C, who was doing fairly well until about 2 weeks ago he started experiencing sudden onset of abdominal pain radiating to his back with lower extremity swelling in both legs, he continued to have this symptom and came to the ER.  In the ER he was found to be severely cachectic, he had abdominal distention with lower extremity edema, CT scan suggestive of new diagnosis of hepatocellular carcinoma with portal vein thrombosis, he was also found to be hyponatremic. I was called to admit the patient.  She denies any previous history of hepatocellular carcinoma but does say that he had hepatitis C diagnosed several years ago he does not know how he contracted it, he does not have a PCP or her regular GI physician and he goes to the shelter clinic. He does condone to 20 pounds of unintentional weight loss in the last 1 month.   Hospital  issues addressed     1. Abdominal pain due to portal vein thrombosis with new diagnosis of most likely hepatocellular carcinoma. Patient was seen by oncology along with the case Was discussedwith GI physician Dr. Collene Mares who recommends nothing further as she has nothing to offer due to his poor prognosis.Oncology recommended that patient's prognosis was less than 6 months and that palliative care should be consulted for hospice. This was done.  He underwent therapeutic and diagnostic paracentesis on 11/25/2015 for fluid removal and 3 L of bloody noninfected ascites fluid was removed, this likely was malignant as well, cytology pending, Was also treated with anticoagulation for his  Portal vein thrombosis, however on 11/28/2015  Patient had episodes of what looked like hematemesis,Anticoagulation was stopped. He was placed on PPI. He also had evidence of acute blood loss related anemia from upper GI bleed.    We had detailed discussion with patient over the last several days he has expressed his wishes that he wants to pursue residential hospice and comfort care only. At this time all aggressive treatment will be stopped and he will be discharged with comfort measures to residential hospice. His AFP  was close to 350,000 and clinically everything points towards  advanced hepatocellular carcinoma with IVC thrombosis pointing towards extremely poor prognosis.    2.DM type II.Supportive care only.  3.History of hep C. Supportive care only at this time.  4.Severe cachexia and severe protein calorie malnutrition. Will place on protein supplement, likely due to #1 above, will also Negative HIV.  5.Oropharyngeal candidiasis. Resolved after  Diflucan and nystatin swish and swallow.  6. Hyponatremia with dehydration. 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   Increase activity slowly    Complete by:  As directed      Discharge Medications     Medication List    STOP taking these medications   ibuprofen 200 MG tablet Commonly known as:  ADVIL,MOTRIN   metFORMIN 500 MG tablet Commonly known as:  GLUCOPHAGE   SLEEP AID PO     TAKE these medications   fluconazole 100 MG tablet Commonly known as:  DIFLUCAN Take 1 tablet (100 mg total) by mouth daily.   insulin NPH-regular Human (70-30) 100 UNIT/ML injection Commonly known as:  NOVOLIN 70/30 Inject 30 Units into the skin 2 (two) times daily with a meal.   LORazepam 2 MG/ML concentrated solution Commonly known as:  ATIVAN Take 0.5 mLs (1 mg total) by mouth every 6 (six) hours as needed for anxiety.   morphine CONCENTRATE 10 MG/0.5ML Soln concentrated solution Take 0.5 mLs (10 mg total) by mouth every 3 (three) hours as needed for moderate pain or severe pain.   ondansetron 4 MG disintegrating tablet Commonly known as:  ZOFRAN-ODT Take 1 tablet (4 mg total) by mouth every 8 (eight) hours as needed for nausea or vomiting.   pantoprazole 40 MG tablet Commonly known as:  PROTONIX Take 1 tablet (40 mg total) by mouth daily. Switch for any other PPI at similar dose and frequency       Follow-up Information    [facility] orange card program Follow up on 11/28/2015.   Why:  You may contact them as needed to assist with getting more information  Contact information: [phone] A referral has been made for you to this company to see if you are a candidate for orange card services  You will be contacted via phone or postcard in mail about how to come in for completing an application        [facility] Follow up on 01/08/2016.   Why:  11am.Bring photo ID Can go to [id number] for meds to be filled @ d/c. Contact information: [place] [phone]          Major procedures and Radiology Reports - PLEASE review detailed and final reports thoroughly  -        Dg Chest 2 View  Result Date: 11/25/2015 CLINICAL DATA:  Bilateral lower extremity swelling for 2  weeks. EXAM: CHEST  2 VIEW COMPARISON:  None. FINDINGS: Asymmetric elevation right hemidiaphragm noted. The lungs are clear wiithout focal pneumonia, edema, pneumothorax or pleural effusion. The cardiopericardial silhouette is within normal limits for size. The visualized bony structures of the thorax are intact. Telemetry leads overlie the chest. IMPRESSION: Asymmetric elevation right hemidiaphragm. No acute cardiopulmonary findings. Electronically Signed   By: Misty Stanley M.D.   On: 11/25/2015 12:33   Ct Abdomen Pelvis W Contrast  Result Date: 11/25/2015 CLINICAL DATA:  Abdominal swelling for the last week. Left flank pain accompanied by dysuria. EXAM: CT ABDOMEN AND PELVIS WITH CONTRAST TECHNIQUE: Multidetector CT imaging of the abdomen and pelvis was performed using the standard protocol following bolus administration of intravenous contrast. CONTRAST:  160mL ISOVUE-300 IOPAMIDOL (ISOVUE-300) INJECTION 61% COMPARISON:  None. FINDINGS: Lower chest:  Unremarkable. Hepatobiliary: Multiple mass lesions are evident within the liver, involving the right liver and medial segment left liver. One of the dominant lesions measures up to 7.5 cm in diameter common fall in the inferior right liver. This is associated with occlusive thrombus in the right portal  venous system. Liver contour is slightly nodular in this patient with a reported history of hepatitis-C infection. There is some trace intrahepatic biliary duct dilatation around the dominant central liver mass. Small calcified gallstones noted. No extrahepatic biliary duct dilatation. Pancreas: No focal mass lesion. No dilatation of the main duct. No intraparenchymal cyst. No peripancreatic edema. Spleen: No splenomegaly. No focal mass lesion. Adrenals/Urinary Tract: No adrenal nodule or mass. Kidneys are unremarkable. No evidence for hydroureter. The urinary bladder appears normal for the degree of distention. Stomach/Bowel: Small hiatal hernia. Stomach is nondistended. No gastric wall thickening. No evidence of outlet obstruction. Duodenum is normally positioned as is the ligament of Treitz. There are some mildly distended distal small bowel loops. Terminal ileum is normal. Appendix is normal. No gross colonic mass. No colonic wall thickening. No substantial diverticular change. Vascular/Lymphatic: There is abdominal aortic atherosclerosis without aneurysm. As mentioned above, there is occlusive thrombus in the right portal venous system. Left portal vein and superior mesenteric vein are prominent, but patent. The splenic vein is patent. Recanalization of the paraumbilical vein is associated with paraesophageal varices, features compatible with portal venous hypertension. There is no gastrohepatic or hepatoduodenal ligament lymphadenopathy. No intraperitoneal or retroperitoneal lymphadenopathy. No pelvic sidewall lymphadenopathy. Reproductive: The prostate gland and seminal vesicles have normal imaging features. Other: Large volume ascites evident. Musculoskeletal: Bone windows reveal no worrisome lytic or sclerotic osseous lesions. IMPRESSION: 1. Multiple large hepatic mass is involving the medial segment left liver and diffusely in the right liver are associated with occlusive thrombus of the right portal  venous system with extension of thrombus into the confluence with the left portal vein. Main portal vein remains patent. Given the history of hepatitis C and the apparent nodular hepatic contour suggesting background cirrhosis, imaging features are consistent with multifocal hepatocellular carcinoma. 2. Large volume ascites. 3. Cholelithiasis. Electronically Signed   By: Misty Stanley M.D.   On: 11/25/2015 12:29   US Paracentesis  Result Date: 11/27/2015 INDICATION: Suspected hepatocellular carcinoma with recurrent abdominal ascites. EXAM: ULTRASOUND GUIDED DIAGNOSTIC AND THERAPEUTIC PARACENTESIS MEDICATIONS: 1% lidocaine COMPLICATIONS: None immediate. PROCEDURE: Informed written consent was obtained from the patient after a discussion of the risks, benefits and alternatives to treatment. A timeout was performed prior to the initiation of the procedure. Initial ultrasound scanning demonstrates a large amount of ascites within the right lower abdominal quadrant. The right lower abdomen  was prepped and draped in the usual sterile fashion. 1% lidocaine was used for local anesthesia. Following this, a 19 gauge, 7-cm, Yueh catheter was introduced. An ultrasound image was saved for documentation purposes. The paracentesis was performed. The catheter was removed and a dressing was applied. The patient tolerated the procedure well without immediate post procedural complication. FINDINGS: A total of approximately 0.15 L of bloody fluid was removed. The procedure was terminated after only 150 cc of fluid was obtained as the fluid within the intra-abdominal cavity was very complex, consistent with clot. This was unable to be evacuated through the [facility] catheter. Samples were sent to the laboratory as requested by the clinical team. IMPRESSION: Successful ultrasound-guided paracentesis yielding 0.15 liters of peritoneal fluid. Read by: Saverio Danker, PA-C Electronically Signed   By: Jacqulynn Cadet M.D.   On: 11/27/2015  15:32   US Paracentesis  Result Date: 11/25/2015 INDICATION: Abdominal pain, CT scan findings of ascites EXAM: ULTRASOUND GUIDED DIAGNOSTIC AND THERAPEUTIC PARACENTESIS MEDICATIONS: 1% lidocaine. COMPLICATIONS: None immediate. PROCEDURE: Informed written consent was obtained from the patient after a discussion of the risks, benefits and alternatives to treatment. A timeout was performed prior to the initiation of the procedure. Initial ultrasound scanning demonstrates a moderate amount of ascites within the right upper abdominal quadrant. The right upper abdomen was prepped and draped in the usual sterile fashion. 1% lidocaine was used for local anesthesia. Following this, a 19 gauge, 7-cm, Yueh catheter was introduced. An ultrasound image was saved for documentation purposes. The paracentesis was performed. The catheter was removed and a dressing was applied. The patient tolerated the procedure well without immediate post procedural complication. FINDINGS: A total of approximately 3 L of bloody fluid was removed. Samples were sent to the laboratory as requested by the clinical team. IMPRESSION: Successful ultrasound-guided paracentesis yielding 3 liters of peritoneal fluid. This was the limit set by the ordering provider. Read by: Saverio Danker, PA-C Electronically Signed   By: Aletta Edouard M.D.   On: 11/25/2015 15:52    Micro Results    Recent Results (from the past 240 hour(s))  Urine culture     Status: None   Collection Time: 11/25/15  1:15 PM  Result Value Ref Range Status   Specimen Description URINE, RANDOM  Final   Special Requests NONE  Final   Culture NO GROWTH Performed at [facility]   Final   Report Status 11/26/2015 FINAL  Final  Culture, body fluid-bottle     Status: None (Preliminary result)   Collection Time: 11/25/15  3:18 PM  Result Value Ref Range Status   Specimen Description FLUID PERITONEAL  Final   Special Requests BOTTLES DRAWN AEROBIC AND ANAEROBIC 10CC   Final   Culture   Final    NO GROWTH 2 DAYS Performed at [facility]    Report Status PENDING  Incomplete  Gram stain     Status: None   Collection Time: 11/25/15  3:18 PM  Result Value Ref Range Status   Specimen Description FLUID PERITONEAL  Final   Special Requests NONE  Final   Gram Stain   Final    FEW WBC PRESENT,BOTH PMN AND MONONUCLEAR NO ORGANISMS SEEN Performed at [facility]    Report Status 11/26/2015 FINAL  Final    Today   Subjective    Peter Anthony today has no headache,no chest abdominal pain,no new weakness tingling or numbness, He wishes to go to residential hospice with comfort measures today.  Objective   Blood pressure 105/69, pulse (!) 114, temperature 97.9 F (36.6 C), temperature source Oral, resp. rate 16, height 6' (1.829 m), weight 64.5 kg (142 lb 3.2 oz), SpO2 95 %.   Intake/Output Summary (Last 24 hours) at 11/28/15 1035 Last data filed at 11/28/15 0732  Gross per 24 hour  Intake             2290 ml  Output              650 ml  Net             1640 ml    Exam Awake Alert, Oriented x 3, No new F.N deficits, Normal affect [place].AT,PERRAL Supple Neck,No JVD, No cervical lymphadenopathy appriciated.  Symmetrical Chest wall movement, Good air movement bilaterally, CTAB RRR,No Gallops,Rubs or new Murmurs, No Parasternal Heave +ve B.Sounds, Abd Soft, Non tender, No organomegaly appriciated, No rebound -guarding or rigidity. No Cyanosis, Clubbing or edema, No new Rash or bruise   Data Review   CBC w Diff:  Lab Results  Component Value Date   WBC 23.2 (H) 11/28/2015   HGB 7.9 (L) 11/28/2015   HCT 23.2 (L) 11/28/2015   PLT 427 (H) 11/28/2015    CMP:  Lab Results  Component Value Date   NA 127 (L) 11/27/2015   K 4.8 11/27/2015   CL 95 (L) 11/27/2015   CO2 26 11/27/2015   BUN 13 11/27/2015   CREATININE 0.61 11/27/2015   PROT 7.1 11/27/2015   ALBUMIN 2.7 (L) 11/27/2015   BILITOT 1.7 (H) 11/27/2015   ALKPHOS 229 (H)  11/27/2015   AST 160 (H) 11/27/2015   ALT 64 (H) 11/27/2015  .   Total Time in preparing paper work, data evaluation and todays exam - 35 minutes  Thurnell Lose M.D on 11/28/2015 at 10:35 AM  [facility]   Office  [phone]

## 2015-11-28 NOTE — Discharge Instructions (Signed)
Activity: As tolerated with Full fall precautions use walker/cane & assistance as needed   Disposition Residential hospice   Diet:  Regular with feeding assistance and aspiration precautions.

## 2015-11-28 NOTE — Progress Notes (Signed)
CSW received consult from Dr. Rowe Pavy for residential hospice home. CSW spoke with patient at bedside who expressed interest in [facility]. CSW made referral to Collie Siad with [facility], unfortunately they have no availability today. CSW checked back with patient and provided residential hospice facility list - patient agreed to have CSW check with [facility] as it would be the next closest. CSW made referral to Beverlee Nims at [facility], awaiting call back re: bed availability/eligibility.    Raynaldo Opitz, [facility] Clinical Social Worker cell #: [phone]

## 2015-11-28 NOTE — Progress Notes (Signed)
Patient is set to discharge to [facility] today. Patient made aware. Discharge packet will be given to RN, Doroteo Bradford. [facility] will be called for transport when ready.     Raynaldo Opitz, [facility] Clinical Social Worker cell #: [phone]

## 2015-11-30 LAB — CULTURE, BODY FLUID W GRAM STAIN -BOTTLE

## 2015-11-30 LAB — CULTURE, BODY FLUID-BOTTLE: CULTURE: NO GROWTH

## 2015-12-20 DEATH — deceased

## 2016-01-08 ENCOUNTER — Ambulatory Visit: Payer: Self-pay | Admitting: Family Medicine

## 2018-02-13 IMAGING — US US PARACENTESIS
1 series · 11 of 11 positions shown · non-contrast
Comparison: none

INDICATION: Abdominal pain, CT scan findings of ascites

[Series 1: us paracentesis · 0.26mm/px · 11 of 11 slices shown]
[im 1/11]
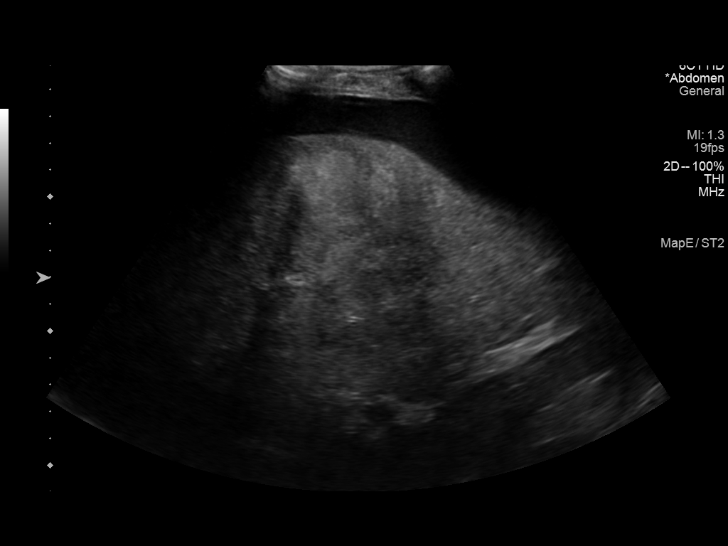
[im 2/11]
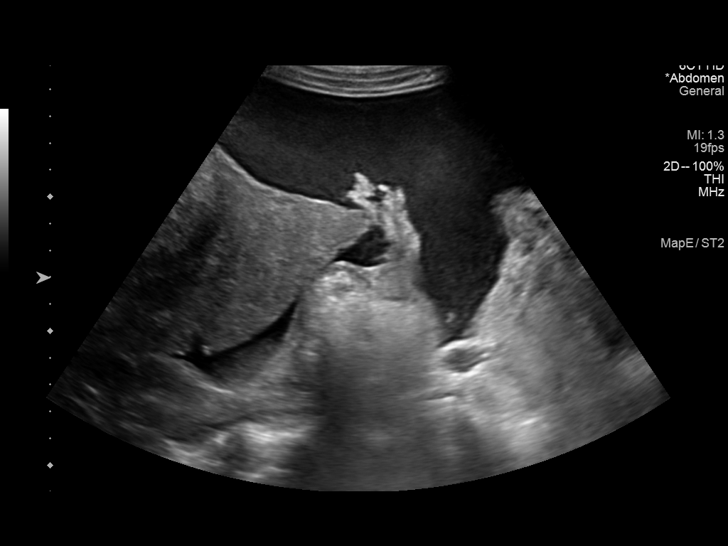
[im 3/11]
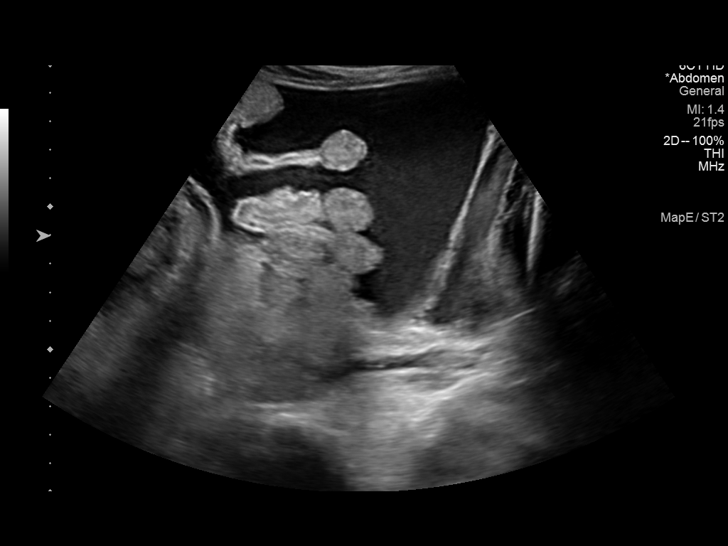
[im 4/11]
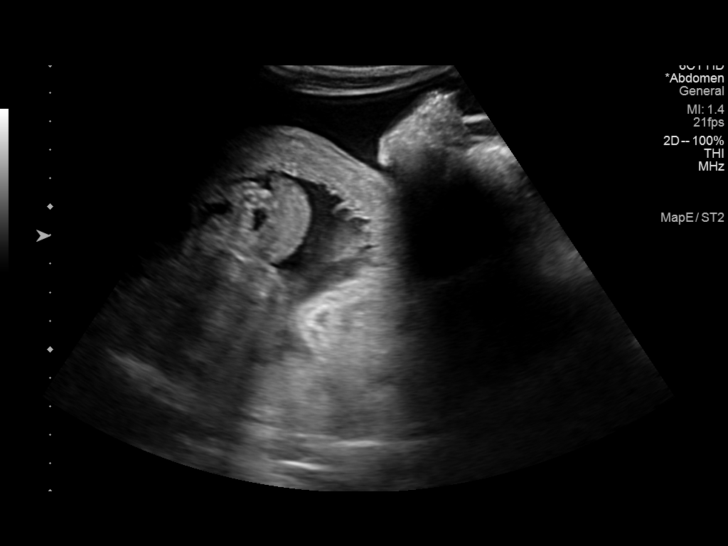
[im 5/11]
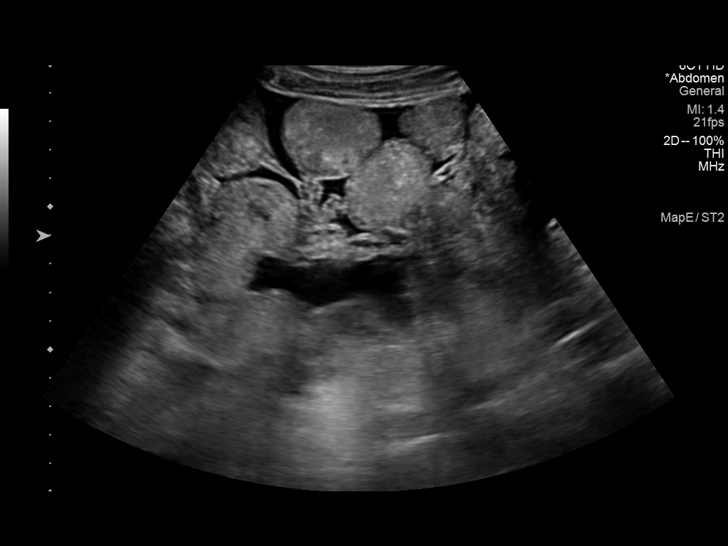
[im 6/11]
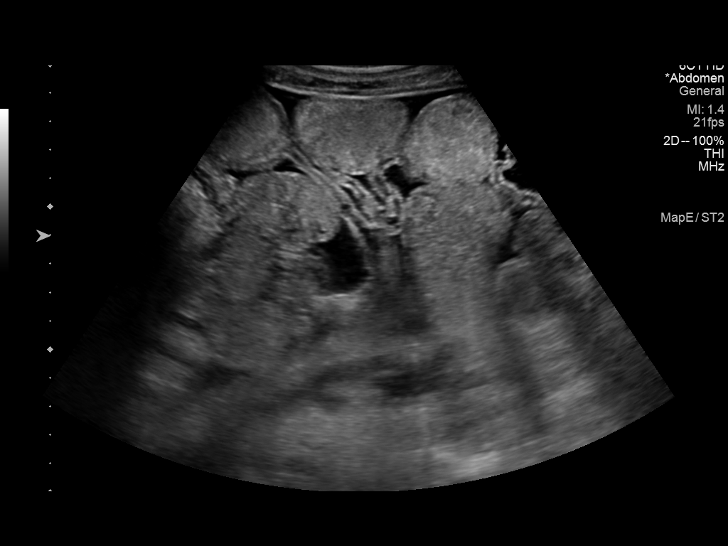
[im 7/11]
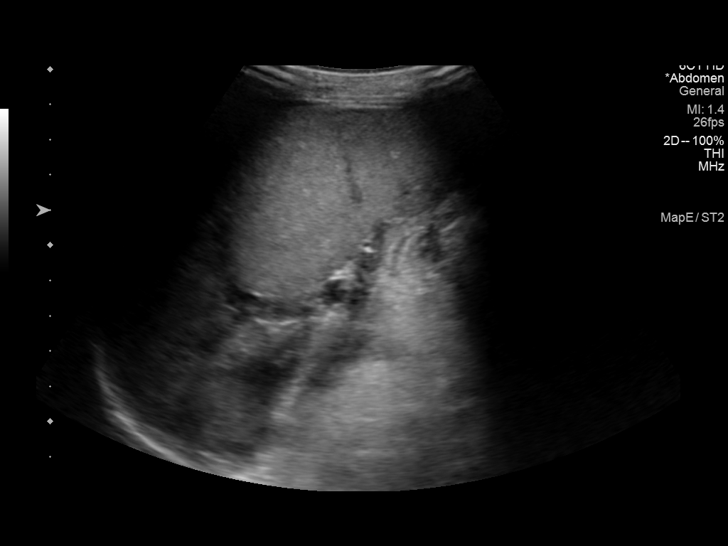
[im 8/11]
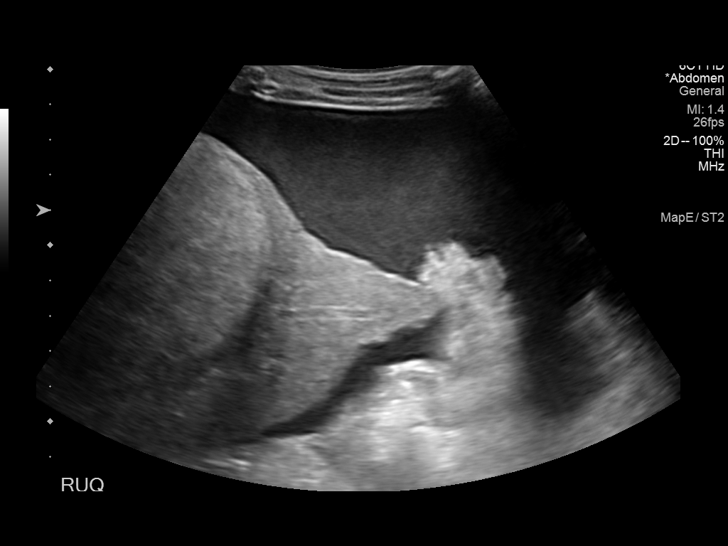
[im 9/11]
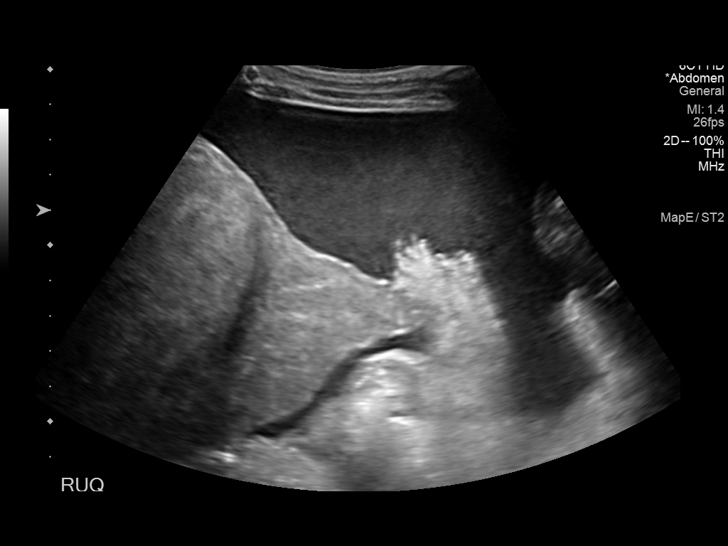
[im 10/11]
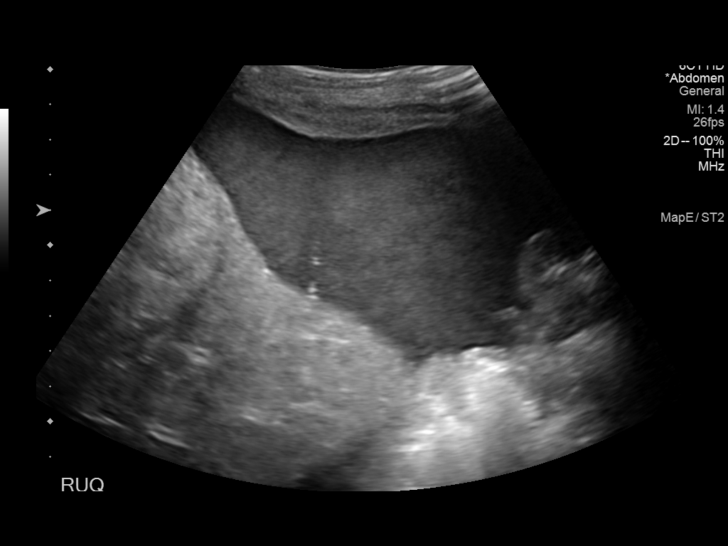
[im 11/11]
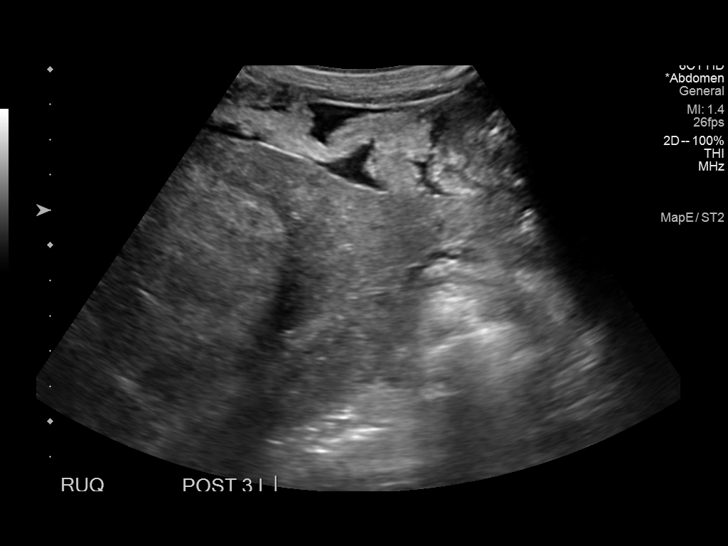

[11 of 11 positions shown; findings below may reference images not displayed]

EXAM:
ULTRASOUND GUIDED DIAGNOSTIC AND THERAPEUTIC PARACENTESIS

MEDICATIONS:
1% lidocaine.

COMPLICATIONS:
None immediate.

PROCEDURE:
Informed written consent was obtained from the patient after a
discussion of the risks, benefits and alternatives to treatment. A
timeout was performed prior to the initiation of the procedure.

Initial ultrasound scanning demonstrates a moderate amount of
ascites within the right upper abdominal quadrant. The right upper
abdomen was prepped and draped in the usual sterile fashion. 1%
lidocaine was used for local anesthesia.

Following this, a 19 gauge, 7-cm, Yueh catheter was introduced. An
ultrasound image was saved for documentation purposes. The
paracentesis was performed. The catheter was removed and a dressing
was applied. The patient tolerated the procedure well without
immediate post procedural complication.
FINDINGS: A total of approximately 3 L of bloody fluid was removed. Samples
were sent to the laboratory as requested by the clinical team.
IMPRESSION: Successful ultrasound-guided paracentesis yielding 3 liters of
peritoneal fluid. This was the limit set by the ordering provider.

## 2018-02-15 IMAGING — US US PARACENTESIS
1 series · 6 of 6 positions shown · non-contrast
Comparison: none

INDICATION: Suspected hepatocellular carcinoma with recurrent abdominal ascites.

[Series 1: us paracentesis · 0.26mm/px · 6 of 6 slices shown]
[im 1/6]
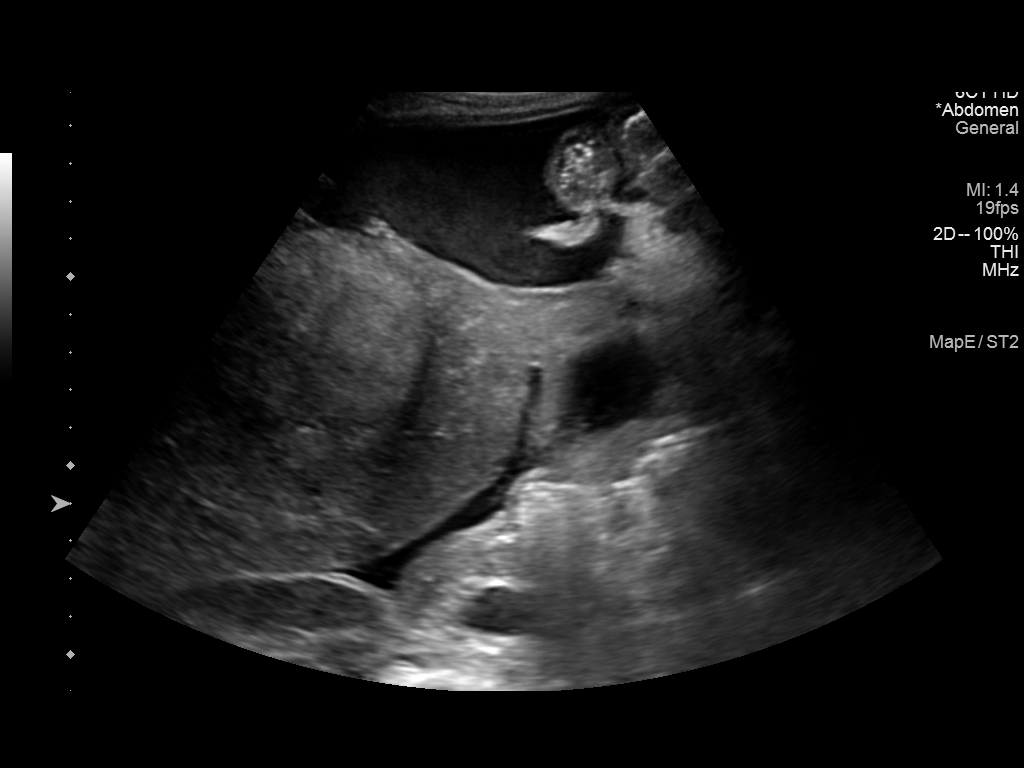
[im 2/6]
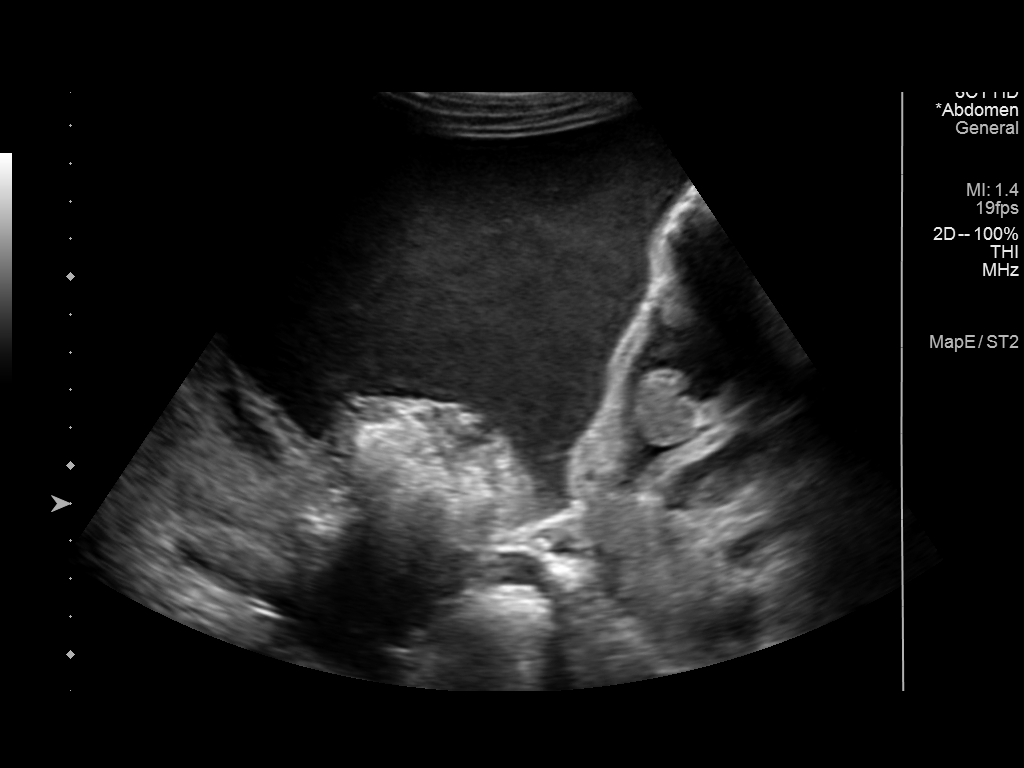
[im 3/6]
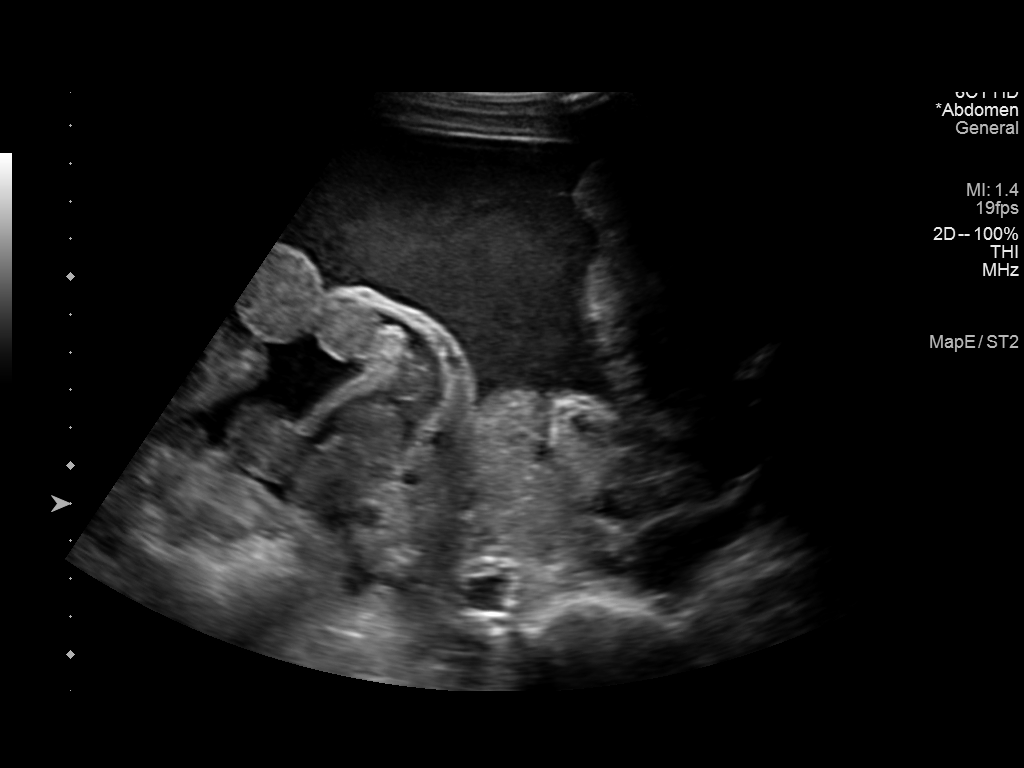
[im 4/6]
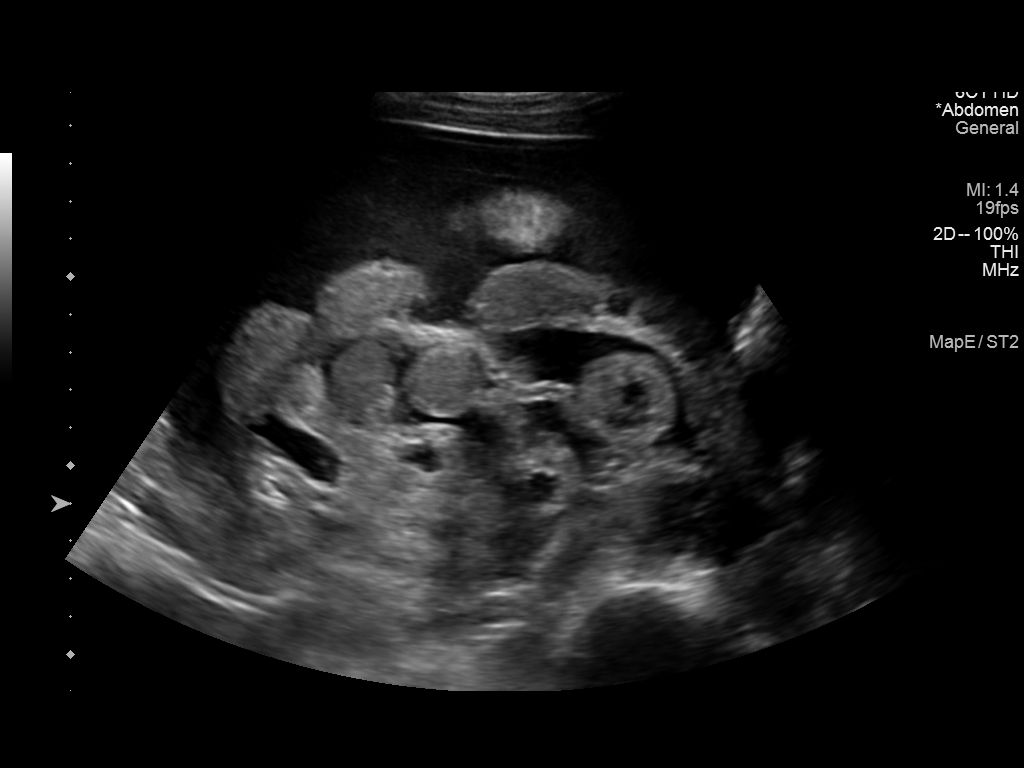
[im 5/6]
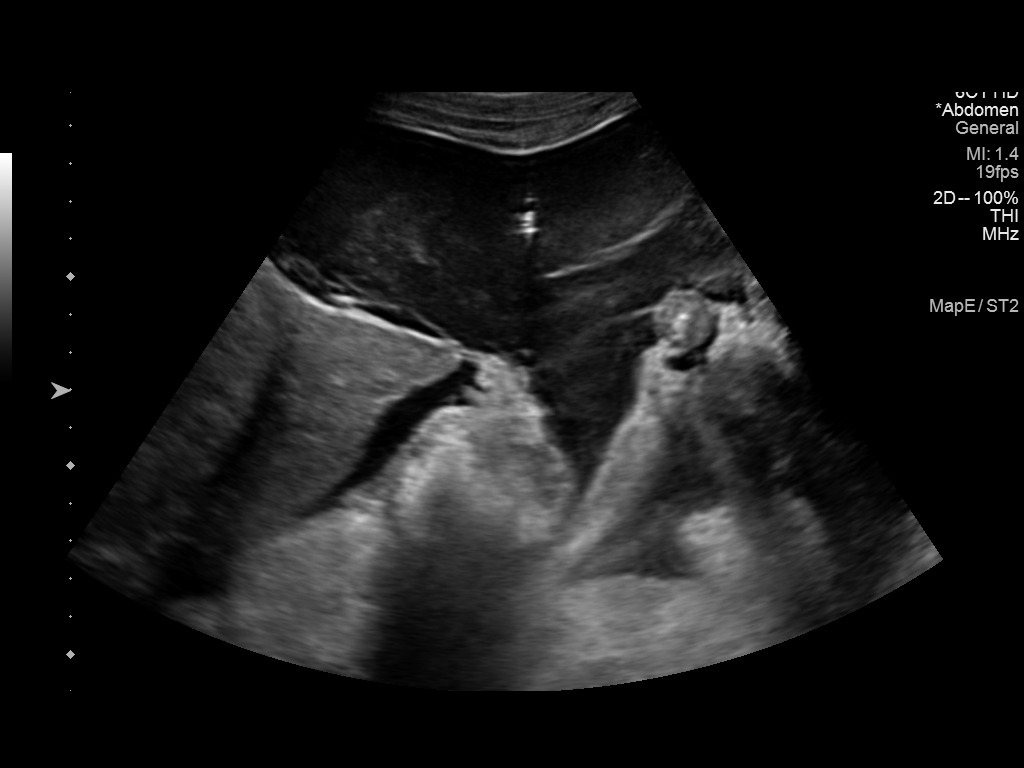
[im 6/6]
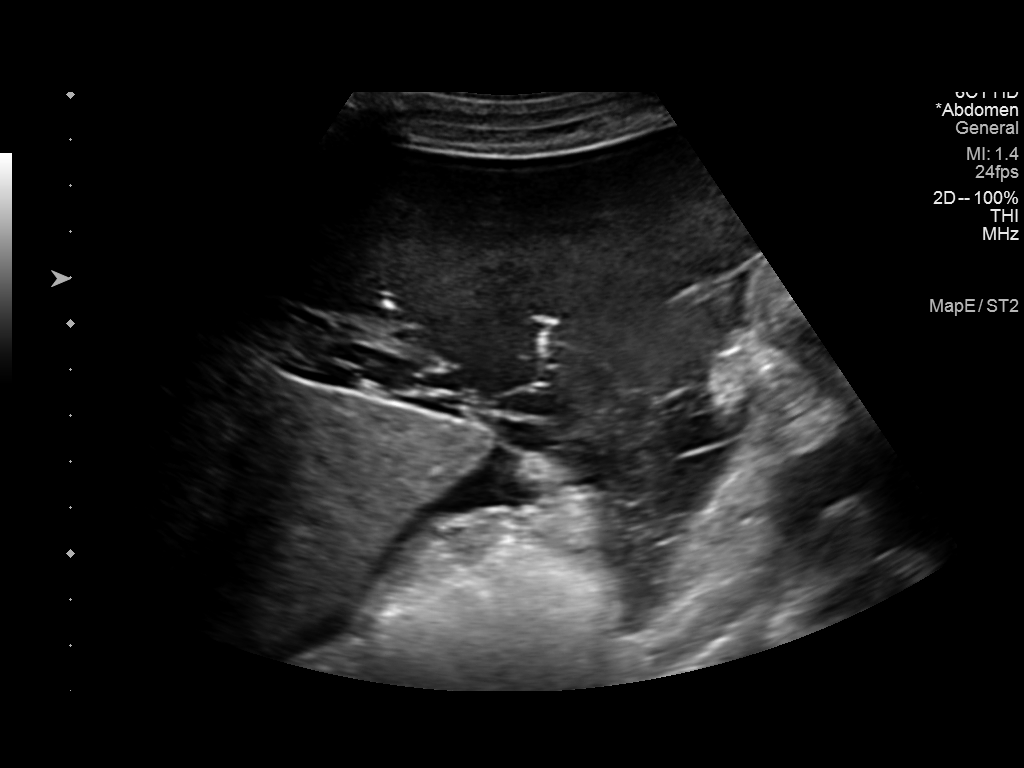

[6 of 6 positions shown; findings below may reference images not displayed]

EXAM:
ULTRASOUND GUIDED DIAGNOSTIC AND THERAPEUTIC PARACENTESIS

MEDICATIONS:
1% lidocaine

COMPLICATIONS:
None immediate.

PROCEDURE:
Informed written consent was obtained from the patient after a
discussion of the risks, benefits and alternatives to treatment. A
timeout was performed prior to the initiation of the procedure.

Initial ultrasound scanning demonstrates a large amount of ascites
within the right lower abdominal quadrant. The right lower abdomen
was prepped and draped in the usual sterile fashion. 1% lidocaine
was used for local anesthesia.

Following this, a 19 gauge, 7-cm, Yueh catheter was introduced. An
ultrasound image was saved for documentation purposes. The
paracentesis was performed. The catheter was removed and a dressing
was applied. The patient tolerated the procedure well without
immediate post procedural complication.
FINDINGS: A total of approximately 0.15 L of bloody fluid was removed. The
procedure was terminated after only 150 cc of fluid was obtained as
the fluid within the intra-abdominal cavity was very complex,
consistent with clot. This was unable to be evacuated through the
Yueh catheter. Samples were sent to the laboratory as requested by
the clinical team.
IMPRESSION: Successful ultrasound-guided paracentesis yielding 0.15 liters of
peritoneal fluid.
